# Patient Record
Sex: Female | Born: 1955 | Race: White | Hispanic: No | Smoking: Former smoker
Health system: Southern US, Community
[De-identification: ages and names within clinical notes are randomized; demographics above are authoritative.]

## PROBLEM LIST (undated history)

## (undated) ENCOUNTER — Encounter

## (undated) ENCOUNTER — Inpatient Hospital Stay

## (undated) ENCOUNTER — Telehealth

## (undated) ENCOUNTER — Other Ambulatory Visit

## (undated) DIAGNOSIS — I82409 Acute embolism and thrombosis of unspecified deep veins of unspecified lower extremity: Secondary | ICD-10-CM

## (undated) DIAGNOSIS — M4800 Spinal stenosis, site unspecified: Secondary | ICD-10-CM

## (undated) HISTORY — DX: Spinal stenosis, site unspecified: M48.00

## (undated) HISTORY — PX: HEMORROIDECTOMY: SUR656

## (undated) HISTORY — PX: CHOLECYSTECTOMY: SHX55

---

## 1999-03-21 ENCOUNTER — Other Ambulatory Visit: Admission: RE | Admit: 1999-03-21 | Discharge: 1999-03-21 | Payer: Self-pay | Admitting: Obstetrics & Gynecology

## 2000-04-02 ENCOUNTER — Other Ambulatory Visit: Admission: RE | Admit: 2000-04-02 | Discharge: 2000-04-02 | Payer: Self-pay | Admitting: Obstetrics & Gynecology

## 2001-02-25 ENCOUNTER — Encounter: Payer: Self-pay | Admitting: Internal Medicine

## 2001-02-25 ENCOUNTER — Inpatient Hospital Stay (HOSPITAL_COMMUNITY): Admission: EM | Admit: 2001-02-25 | Discharge: 2001-02-28 | Payer: Self-pay | Admitting: Emergency Medicine

## 2001-03-04 ENCOUNTER — Encounter: Admission: RE | Admit: 2001-03-04 | Discharge: 2001-03-04 | Payer: Self-pay

## 2001-03-09 ENCOUNTER — Encounter: Admission: RE | Admit: 2001-03-09 | Discharge: 2001-03-09 | Payer: Self-pay | Admitting: Internal Medicine

## 2001-03-16 ENCOUNTER — Encounter: Admission: RE | Admit: 2001-03-16 | Discharge: 2001-03-16 | Payer: Self-pay

## 2001-03-24 ENCOUNTER — Encounter: Admission: RE | Admit: 2001-03-24 | Discharge: 2001-03-24 | Payer: Self-pay | Admitting: Internal Medicine

## 2001-03-30 ENCOUNTER — Encounter: Admission: RE | Admit: 2001-03-30 | Discharge: 2001-03-30 | Payer: Self-pay | Admitting: Internal Medicine

## 2001-05-01 ENCOUNTER — Ambulatory Visit: Admission: RE | Admit: 2001-05-01 | Discharge: 2001-05-01 | Payer: Self-pay | Admitting: Hematology & Oncology

## 2001-06-16 ENCOUNTER — Ambulatory Visit: Admission: RE | Admit: 2001-06-16 | Discharge: 2001-06-16 | Payer: Self-pay | Admitting: Hematology & Oncology

## 2001-06-23 ENCOUNTER — Other Ambulatory Visit: Admission: RE | Admit: 2001-06-23 | Discharge: 2001-06-23 | Payer: Self-pay | Admitting: Obstetrics & Gynecology

## 2001-07-16 ENCOUNTER — Emergency Department (HOSPITAL_COMMUNITY): Admission: EM | Admit: 2001-07-16 | Discharge: 2001-07-16 | Payer: Self-pay | Admitting: Emergency Medicine

## 2001-12-01 ENCOUNTER — Encounter: Payer: Self-pay | Admitting: Emergency Medicine

## 2001-12-01 ENCOUNTER — Emergency Department (HOSPITAL_COMMUNITY): Admission: EM | Admit: 2001-12-01 | Discharge: 2001-12-01 | Payer: Self-pay | Admitting: Emergency Medicine

## 2001-12-02 ENCOUNTER — Emergency Department (HOSPITAL_COMMUNITY): Admission: EM | Admit: 2001-12-02 | Discharge: 2001-12-02 | Payer: Self-pay | Admitting: Emergency Medicine

## 2002-01-14 ENCOUNTER — Inpatient Hospital Stay (HOSPITAL_COMMUNITY): Admission: EM | Admit: 2002-01-14 | Discharge: 2002-01-16 | Payer: Self-pay | Admitting: Emergency Medicine

## 2002-01-14 ENCOUNTER — Encounter: Payer: Self-pay | Admitting: Emergency Medicine

## 2002-01-22 ENCOUNTER — Ambulatory Visit (HOSPITAL_COMMUNITY): Admission: RE | Admit: 2002-01-22 | Discharge: 2002-01-22 | Payer: Self-pay | Admitting: *Deleted

## 2002-01-22 ENCOUNTER — Encounter: Payer: Self-pay | Admitting: *Deleted

## 2002-10-18 ENCOUNTER — Other Ambulatory Visit: Admission: RE | Admit: 2002-10-18 | Discharge: 2002-10-18 | Payer: Self-pay | Admitting: Obstetrics & Gynecology

## 2003-04-08 ENCOUNTER — Ambulatory Visit: Admission: RE | Admit: 2003-04-08 | Discharge: 2003-04-08 | Payer: Self-pay | Admitting: Hematology & Oncology

## 2003-05-06 ENCOUNTER — Emergency Department (HOSPITAL_COMMUNITY): Admission: EM | Admit: 2003-05-06 | Discharge: 2003-05-06 | Payer: Self-pay | Admitting: Emergency Medicine

## 2003-06-13 ENCOUNTER — Emergency Department (HOSPITAL_COMMUNITY): Admission: EM | Admit: 2003-06-13 | Discharge: 2003-06-13 | Payer: Self-pay | Admitting: *Deleted

## 2003-09-08 ENCOUNTER — Ambulatory Visit (HOSPITAL_COMMUNITY): Admission: RE | Admit: 2003-09-08 | Discharge: 2003-09-08 | Payer: Self-pay | Admitting: *Deleted

## 2003-10-13 ENCOUNTER — Emergency Department (HOSPITAL_COMMUNITY): Admission: EM | Admit: 2003-10-13 | Discharge: 2003-10-13 | Payer: Self-pay | Admitting: Emergency Medicine

## 2003-11-07 ENCOUNTER — Ambulatory Visit (HOSPITAL_COMMUNITY): Admission: RE | Admit: 2003-11-07 | Discharge: 2003-11-07 | Payer: Self-pay | Admitting: Hematology & Oncology

## 2003-12-07 ENCOUNTER — Other Ambulatory Visit: Admission: RE | Admit: 2003-12-07 | Discharge: 2003-12-07 | Payer: Self-pay | Admitting: Obstetrics & Gynecology

## 2003-12-22 ENCOUNTER — Ambulatory Visit (HOSPITAL_BASED_OUTPATIENT_CLINIC_OR_DEPARTMENT_OTHER): Admission: RE | Admit: 2003-12-22 | Discharge: 2003-12-22 | Payer: Self-pay | Admitting: Internal Medicine

## 2003-12-31 ENCOUNTER — Ambulatory Visit: Payer: Self-pay | Admitting: Hematology & Oncology

## 2004-01-11 ENCOUNTER — Ambulatory Visit (HOSPITAL_COMMUNITY): Admission: RE | Admit: 2004-01-11 | Discharge: 2004-01-11 | Payer: Self-pay | Admitting: Obstetrics & Gynecology

## 2004-01-11 ENCOUNTER — Encounter (INDEPENDENT_AMBULATORY_CARE_PROVIDER_SITE_OTHER): Payer: Self-pay | Admitting: *Deleted

## 2004-03-14 ENCOUNTER — Ambulatory Visit (HOSPITAL_COMMUNITY): Admission: RE | Admit: 2004-03-14 | Discharge: 2004-03-14 | Payer: Self-pay | Admitting: Hematology & Oncology

## 2004-03-30 ENCOUNTER — Ambulatory Visit: Payer: Self-pay | Admitting: Hematology & Oncology

## 2004-04-02 ENCOUNTER — Ambulatory Visit (HOSPITAL_COMMUNITY): Admission: RE | Admit: 2004-04-02 | Discharge: 2004-04-02 | Payer: Self-pay | Admitting: Hematology & Oncology

## 2004-05-23 ENCOUNTER — Ambulatory Visit: Payer: Self-pay | Admitting: Hematology & Oncology

## 2004-07-18 ENCOUNTER — Ambulatory Visit: Payer: Self-pay | Admitting: Hematology & Oncology

## 2004-07-31 ENCOUNTER — Emergency Department (HOSPITAL_COMMUNITY): Admission: EM | Admit: 2004-07-31 | Discharge: 2004-07-31 | Payer: Self-pay | Admitting: Emergency Medicine

## 2004-10-10 ENCOUNTER — Ambulatory Visit: Payer: Self-pay | Admitting: Hematology & Oncology

## 2004-11-28 ENCOUNTER — Ambulatory Visit: Payer: Self-pay | Admitting: Hematology & Oncology

## 2004-12-27 ENCOUNTER — Ambulatory Visit: Admission: RE | Admit: 2004-12-27 | Discharge: 2004-12-27 | Payer: Self-pay | Admitting: Hematology & Oncology

## 2005-01-23 ENCOUNTER — Ambulatory Visit: Payer: Self-pay | Admitting: Hematology & Oncology

## 2005-02-07 ENCOUNTER — Other Ambulatory Visit: Admission: RE | Admit: 2005-02-07 | Discharge: 2005-02-07 | Payer: Self-pay | Admitting: Obstetrics and Gynecology

## 2005-03-13 ENCOUNTER — Ambulatory Visit: Payer: Self-pay | Admitting: Hematology & Oncology

## 2005-05-01 ENCOUNTER — Ambulatory Visit: Payer: Self-pay | Admitting: Hematology & Oncology

## 2005-05-14 ENCOUNTER — Ambulatory Visit: Payer: Self-pay | Admitting: Cardiology

## 2005-06-13 LAB — PROTIME-INR
INR: 1.1 — ABNORMAL LOW (ref 2.00–3.50)
Protime: 13.3 Seconds (ref 10.6–13.4)

## 2005-06-17 ENCOUNTER — Ambulatory Visit: Payer: Self-pay | Admitting: Hematology & Oncology

## 2005-06-27 ENCOUNTER — Encounter (INDEPENDENT_AMBULATORY_CARE_PROVIDER_SITE_OTHER): Payer: Self-pay | Admitting: *Deleted

## 2005-06-28 ENCOUNTER — Inpatient Hospital Stay (HOSPITAL_COMMUNITY): Admission: RE | Admit: 2005-06-28 | Discharge: 2005-06-29 | Payer: Self-pay | Admitting: General Surgery

## 2005-07-24 ENCOUNTER — Ambulatory Visit: Admission: RE | Admit: 2005-07-24 | Discharge: 2005-07-24 | Payer: Self-pay | Admitting: Hematology & Oncology

## 2005-07-26 LAB — PROTIME-INR: INR: 3.4 (ref 2.00–3.50)

## 2005-08-07 ENCOUNTER — Ambulatory Visit: Payer: Self-pay | Admitting: Hematology & Oncology

## 2005-08-15 ENCOUNTER — Ambulatory Visit (HOSPITAL_COMMUNITY): Payer: Self-pay | Admitting: *Deleted

## 2005-08-27 ENCOUNTER — Emergency Department (HOSPITAL_COMMUNITY): Admission: EM | Admit: 2005-08-27 | Discharge: 2005-08-28 | Payer: Self-pay | Admitting: Emergency Medicine

## 2005-09-02 LAB — CBC WITH DIFFERENTIAL/PLATELET
Eosinophils Absolute: 0 10*3/uL (ref 0.0–0.5)
LYMPH%: 12.8 % — ABNORMAL LOW (ref 14.0–48.0)
MCH: 35.2 pg — ABNORMAL HIGH (ref 26.0–34.0)
MCHC: 35.4 g/dL (ref 32.0–36.0)
MCV: 99.3 fL (ref 81.0–101.0)
MONO%: 7.9 % (ref 0.0–13.0)
NEUT#: 6.9 10*3/uL — ABNORMAL HIGH (ref 1.5–6.5)
Platelets: 322 10*3/uL (ref 145–400)
RBC: 3.69 10*6/uL — ABNORMAL LOW (ref 3.70–5.32)

## 2005-09-02 LAB — PROTIME-INR: Protime: 12.6 Seconds (ref 10.6–13.4)

## 2005-09-09 LAB — PROTIME-INR: INR: 1.6 (ref 2.00–3.50)

## 2005-10-01 ENCOUNTER — Ambulatory Visit: Payer: Self-pay | Admitting: Hematology & Oncology

## 2005-10-31 LAB — PROTIME-INR
INR: 2.1 (ref 2.00–3.50)
Protime: 25.2 Seconds — ABNORMAL HIGH (ref 10.6–13.4)

## 2005-11-07 ENCOUNTER — Ambulatory Visit: Payer: Self-pay | Admitting: Cardiology

## 2005-11-11 LAB — PROTIME-INR: Protime: 27.6 Seconds — ABNORMAL HIGH (ref 10.6–13.4)

## 2005-11-26 ENCOUNTER — Ambulatory Visit: Payer: Self-pay | Admitting: Hematology & Oncology

## 2006-01-15 ENCOUNTER — Ambulatory Visit: Payer: Self-pay | Admitting: Hematology & Oncology

## 2006-01-20 LAB — PROTIME-INR
INR: 3.2 (ref 2.00–3.50)
Protime: 38.4 Seconds — ABNORMAL HIGH (ref 10.6–13.4)

## 2006-01-20 LAB — CBC WITH DIFFERENTIAL/PLATELET
BASO%: 0.7 % (ref 0.0–2.0)
Basophils Absolute: 0 10*3/uL (ref 0.0–0.1)
EOS%: 0.7 % (ref 0.0–7.0)
HGB: 12.1 g/dL (ref 11.6–15.9)
MCH: 35.3 pg — ABNORMAL HIGH (ref 26.0–34.0)
MCHC: 35.9 g/dL (ref 32.0–36.0)
MCV: 98.1 fL (ref 81.0–101.0)
MONO%: 7.1 % (ref 0.0–13.0)
RBC: 3.44 10*6/uL — ABNORMAL LOW (ref 3.70–5.32)
RDW: 15.1 % — ABNORMAL HIGH (ref 11.3–14.5)
lymph#: 1.6 10*3/uL (ref 0.9–3.3)

## 2006-01-28 LAB — PROTIME-INR: INR: 2.5 (ref 2.00–3.50)

## 2006-02-04 LAB — PROTIME-INR
INR: 3.4 (ref 2.00–3.50)
Protime: 40.8 Seconds — ABNORMAL HIGH (ref 10.6–13.4)

## 2006-03-03 ENCOUNTER — Ambulatory Visit: Payer: Self-pay | Admitting: Hematology & Oncology

## 2006-04-21 ENCOUNTER — Ambulatory Visit: Admission: RE | Admit: 2006-04-21 | Discharge: 2006-04-21 | Payer: Self-pay | Admitting: Hematology & Oncology

## 2006-04-21 ENCOUNTER — Ambulatory Visit: Payer: Self-pay | Admitting: Hematology & Oncology

## 2006-04-21 LAB — PROTIME-INR
INR: 2.4 (ref 2.00–3.50)
Protime: 28.8 Seconds — ABNORMAL HIGH (ref 10.6–13.4)

## 2006-07-02 ENCOUNTER — Ambulatory Visit: Payer: Self-pay | Admitting: Hematology & Oncology

## 2006-09-01 ENCOUNTER — Ambulatory Visit: Payer: Self-pay | Admitting: Hematology & Oncology

## 2006-09-01 LAB — PROTIME-INR
INR: 2.3 (ref 2.00–3.50)
Protime: 27.6 Seconds — ABNORMAL HIGH (ref 10.6–13.4)

## 2006-10-20 ENCOUNTER — Ambulatory Visit: Payer: Self-pay | Admitting: Hematology & Oncology

## 2006-12-04 ENCOUNTER — Ambulatory Visit: Payer: Self-pay | Admitting: Hematology & Oncology

## 2007-01-05 LAB — PROTIME-INR: Protime: 30 Seconds — ABNORMAL HIGH (ref 10.6–13.4)

## 2007-01-16 LAB — HEPATIC FUNCTION PANEL
Albumin: 4.1 g/dL (ref 3.5–5.2)
Total Bilirubin: 0.6 mg/dL (ref 0.3–1.2)

## 2007-01-16 LAB — LIPID PANEL
Cholesterol: 195 mg/dL (ref 0–200)
HDL: 71 mg/dL (ref >39)
Total CHOL/HDL Ratio: 2.7 Ratio

## 2007-02-02 ENCOUNTER — Ambulatory Visit: Payer: Self-pay | Admitting: Hematology & Oncology

## 2007-03-26 ENCOUNTER — Ambulatory Visit: Payer: Self-pay | Admitting: Hematology & Oncology

## 2007-03-30 LAB — CBC WITH DIFFERENTIAL/PLATELET
Basophils Absolute: 0 10*3/uL (ref 0.0–0.1)
EOS%: 1.5 % (ref 0.0–7.0)
Eosinophils Absolute: 0.1 10*3/uL (ref 0.0–0.5)
HCT: 40 % (ref 34.8–46.6)
HGB: 14.5 g/dL (ref 11.6–15.9)
MCH: 34.3 pg — ABNORMAL HIGH (ref 26.0–34.0)
MCV: 94.6 fL (ref 81.0–101.0)
MONO%: 8.9 % (ref 0.0–13.0)
NEUT#: 2.6 10*3/uL (ref 1.5–6.5)
NEUT%: 56 % (ref 39.6–76.8)
RDW: 10.5 % — ABNORMAL LOW (ref 11.3–14.5)

## 2007-03-30 LAB — PROTIME-INR: INR: 2.5 (ref 2.00–3.50)

## 2007-04-17 IMAGING — CR DG CHEST 2V
2 series · 2 of 2 positions shown · non-contrast
Comparison: none

CLINICAL DATA: 49-year-old, chest pain and shortness of breath.  
 CHEST - TWO VIEW:  
 Two views of the chest are compared to prior study from 05/06/2003.  The cardiac silhouette, mediastinal and hilar contours are stable.  There is streaky subsegmental atelectasis in the left lower lobe.  No focal infiltrate, edema, or effusion.

[view not recorded (1 of 2)]
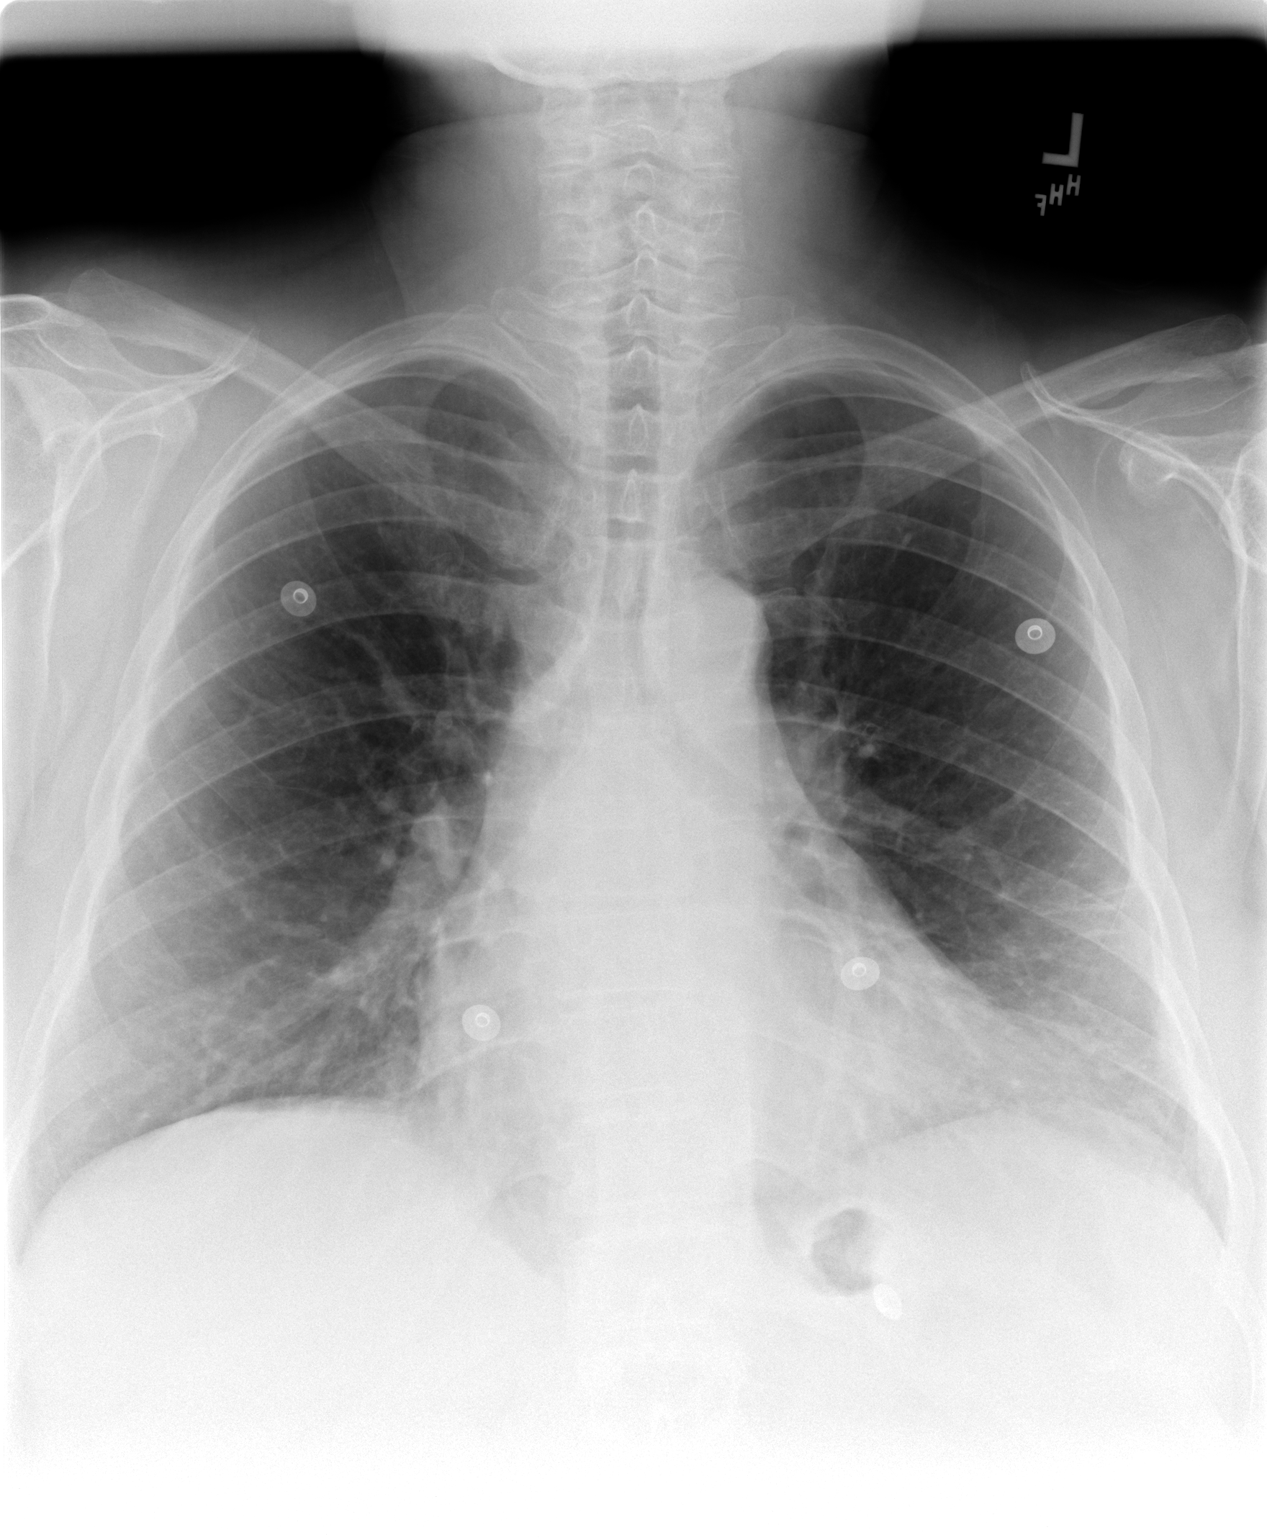

[view not recorded (2 of 2)]
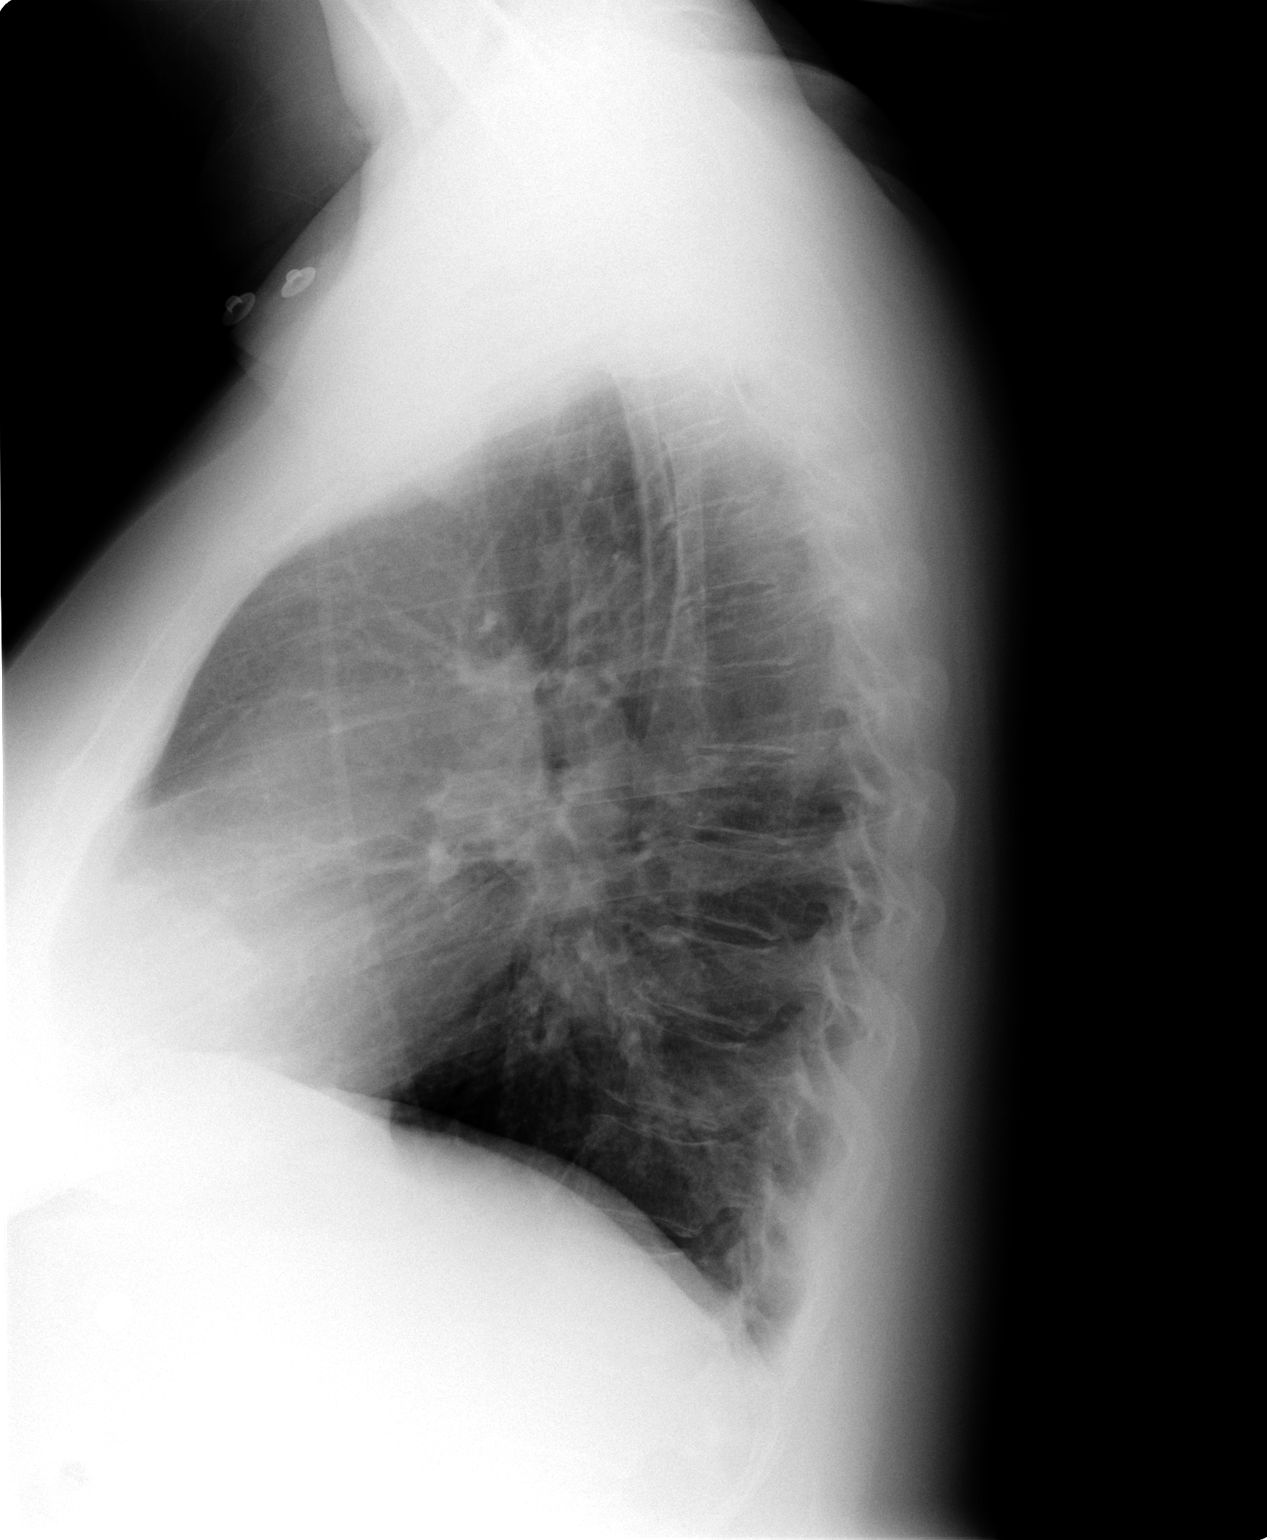

[2 of 2 positions shown; findings below may reference images not displayed]

IMPRESSION: Streaky subsegmental atelectasis in the left lower lobe.  No infiltrates, edema, or effusions.

## 2007-05-28 ENCOUNTER — Ambulatory Visit: Payer: Self-pay | Admitting: Hematology & Oncology

## 2007-06-29 LAB — CBC WITH DIFFERENTIAL/PLATELET
BASO%: 0.2 % (ref 0.0–2.0)
EOS%: 1 % (ref 0.0–7.0)
Eosinophils Absolute: 0.1 10*3/uL (ref 0.0–0.5)
LYMPH%: 27.7 % (ref 14.0–48.0)
MCH: 32.3 pg (ref 26.0–34.0)
MCHC: 35.1 g/dL (ref 32.0–36.0)
MCV: 91.9 fL (ref 81.0–101.0)
MONO%: 7.3 % (ref 0.0–13.0)
NEUT#: 3.4 10*3/uL (ref 1.5–6.5)
Platelets: 284 10*3/uL (ref 145–400)
RBC: 4.05 10*6/uL (ref 3.70–5.32)
RDW: 12.4 % (ref 11.3–14.5)

## 2007-06-29 LAB — COMPREHENSIVE METABOLIC PANEL
AST: 20 U/L (ref 0–37)
Albumin: 4.2 g/dL (ref 3.5–5.2)
Alkaline Phosphatase: 63 U/L (ref 39–117)
BUN: 8 mg/dL (ref 6–23)
Creatinine, Ser: 0.65 mg/dL (ref 0.40–1.20)
Glucose, Bld: 102 mg/dL — ABNORMAL HIGH (ref 70–99)
Total Bilirubin: 0.5 mg/dL (ref 0.3–1.2)

## 2007-06-29 LAB — PROTIME-INR
INR: 1.2 — ABNORMAL LOW (ref 2.00–3.50)
Protime: 14.4 Seconds — ABNORMAL HIGH (ref 10.6–13.4)

## 2007-07-23 ENCOUNTER — Ambulatory Visit: Payer: Self-pay | Admitting: Hematology & Oncology

## 2007-07-27 LAB — PROTIME-INR
INR: 1.8 — ABNORMAL LOW (ref 2.00–3.50)
Protime: 21.6 Seconds — ABNORMAL HIGH (ref 10.6–13.4)

## 2007-08-04 ENCOUNTER — Emergency Department (HOSPITAL_COMMUNITY): Admission: EM | Admit: 2007-08-04 | Discharge: 2007-08-04 | Payer: Self-pay | Admitting: Emergency Medicine

## 2007-08-05 ENCOUNTER — Inpatient Hospital Stay (HOSPITAL_COMMUNITY): Admission: EM | Admit: 2007-08-05 | Discharge: 2007-08-07 | Payer: Self-pay | Admitting: Emergency Medicine

## 2007-08-10 LAB — PROTIME-INR

## 2007-08-26 ENCOUNTER — Ambulatory Visit: Payer: Self-pay | Admitting: Hematology & Oncology

## 2007-09-04 LAB — PROTIME-INR (CHCC SATELLITE)
INR: 2.1 (ref 2.0–3.5)
Protime: 25.2 Seconds — ABNORMAL HIGH (ref 10.6–13.4)

## 2007-09-30 ENCOUNTER — Ambulatory Visit (HOSPITAL_COMMUNITY): Admission: RE | Admit: 2007-09-30 | Discharge: 2007-09-30 | Payer: Self-pay | Admitting: Family Medicine

## 2007-10-05 ENCOUNTER — Inpatient Hospital Stay (HOSPITAL_COMMUNITY): Admission: EM | Admit: 2007-10-05 | Discharge: 2007-10-09 | Payer: Self-pay | Admitting: Emergency Medicine

## 2007-10-07 ENCOUNTER — Encounter (INDEPENDENT_AMBULATORY_CARE_PROVIDER_SITE_OTHER): Payer: Self-pay | Admitting: Surgery

## 2007-10-28 ENCOUNTER — Ambulatory Visit: Payer: Self-pay | Admitting: Hematology & Oncology

## 2007-10-29 LAB — CBC WITH DIFFERENTIAL (CANCER CENTER ONLY)
BASO%: 0.7 % (ref 0.0–2.0)
LYMPH%: 37.7 % (ref 14.0–48.0)
MCH: 29.4 pg (ref 26.0–34.0)
MCV: 86 fL (ref 81–101)
MONO%: 5.3 % (ref 0.0–13.0)
NEUT#: 3.2 10*3/uL (ref 1.5–6.5)
Platelets: 316 10*3/uL (ref 145–400)
RBC: 4.85 10*6/uL (ref 3.70–5.32)
RDW: 11.9 % (ref 10.5–14.6)
WBC: 5.9 10*3/uL (ref 3.9–10.0)

## 2007-12-14 ENCOUNTER — Ambulatory Visit: Payer: Self-pay | Admitting: Cardiology

## 2007-12-25 ENCOUNTER — Ambulatory Visit: Payer: Self-pay | Admitting: Hematology & Oncology

## 2008-01-14 LAB — CBC WITH DIFFERENTIAL (CANCER CENTER ONLY)
BASO#: 0.1 10*3/uL (ref 0.0–0.2)
EOS%: 1.7 % (ref 0.0–7.0)
Eosinophils Absolute: 0.1 10*3/uL (ref 0.0–0.5)
LYMPH%: 40.8 % (ref 14.0–48.0)
MCH: 30.1 pg (ref 26.0–34.0)
MCHC: 34.4 g/dL (ref 32.0–36.0)
MCV: 87 fL (ref 81–101)
MONO%: 6.3 % (ref 0.0–13.0)
NEUT#: 3 10*3/uL (ref 1.5–6.5)
Platelets: 267 10*3/uL (ref 145–400)
RBC: 4.54 10*6/uL (ref 3.70–5.32)

## 2008-01-14 LAB — PROTIME-INR (CHCC SATELLITE): Protime: 18 Seconds — ABNORMAL HIGH (ref 10.6–13.4)

## 2008-01-14 LAB — COMPREHENSIVE METABOLIC PANEL
ALT: 15 U/L (ref 0–35)
AST: 17 U/L (ref 0–37)
Albumin: 4.2 g/dL (ref 3.5–5.2)
Calcium: 9.2 mg/dL (ref 8.4–10.5)
Chloride: 103 mEq/L (ref 96–112)
Potassium: 3.9 mEq/L (ref 3.5–5.3)
Total Protein: 7.5 g/dL (ref 6.0–8.3)

## 2008-01-31 ENCOUNTER — Encounter: Admission: RE | Admit: 2008-01-31 | Discharge: 2008-01-31 | Payer: Self-pay | Admitting: Hematology & Oncology

## 2008-03-17 DIAGNOSIS — F17200 Nicotine dependence, unspecified, uncomplicated: Secondary | ICD-10-CM

## 2008-03-17 DIAGNOSIS — Z8672 Personal history of thrombophlebitis: Secondary | ICD-10-CM

## 2008-03-17 DIAGNOSIS — E785 Hyperlipidemia, unspecified: Secondary | ICD-10-CM | POA: Insufficient documentation

## 2008-03-17 DIAGNOSIS — D682 Hereditary deficiency of other clotting factors: Secondary | ICD-10-CM

## 2008-03-17 DIAGNOSIS — E669 Obesity, unspecified: Secondary | ICD-10-CM

## 2008-03-18 ENCOUNTER — Ambulatory Visit: Payer: Self-pay | Admitting: Hematology & Oncology

## 2008-05-02 LAB — CBC WITH DIFFERENTIAL (CANCER CENTER ONLY)
BASO#: 0.1 10*3/uL (ref 0.0–0.2)
EOS%: 2.3 % (ref 0.0–7.0)
Eosinophils Absolute: 0.1 10*3/uL (ref 0.0–0.5)
HCT: 44.8 % (ref 34.8–46.6)
HGB: 14.8 g/dL (ref 11.6–15.9)
LYMPH%: 31.4 % (ref 14.0–48.0)
MCH: 30.2 pg (ref 26.0–34.0)
MCHC: 33.1 g/dL (ref 32.0–36.0)
MCV: 91 fL (ref 81–101)
MONO%: 5.9 % (ref 0.0–13.0)
NEUT%: 59.6 % (ref 39.6–80.0)
RBC: 4.92 10*6/uL (ref 3.70–5.32)

## 2008-05-02 LAB — PROTIME-INR (CHCC SATELLITE)
INR: 1.2 — ABNORMAL LOW (ref 2.0–3.5)
Protime: 14.4 Seconds — ABNORMAL HIGH (ref 10.6–13.4)

## 2008-06-21 ENCOUNTER — Ambulatory Visit: Payer: Self-pay | Admitting: Hematology & Oncology

## 2008-06-22 LAB — CBC WITH DIFFERENTIAL (CANCER CENTER ONLY)
BASO#: 0 10*3/uL (ref 0.0–0.2)
EOS%: 1.5 % (ref 0.0–7.0)
Eosinophils Absolute: 0.1 10*3/uL (ref 0.0–0.5)
HCT: 41.4 % (ref 34.8–46.6)
HGB: 14.2 g/dL (ref 11.6–15.9)
LYMPH#: 2.6 10*3/uL (ref 0.9–3.3)
MCHC: 34.3 g/dL (ref 32.0–36.0)
MONO#: 0.4 10*3/uL (ref 0.1–0.9)
NEUT#: 2.8 10*3/uL (ref 1.5–6.5)
NEUT%: 47 % (ref 39.6–80.0)
RBC: 4.65 10*6/uL (ref 3.70–5.32)

## 2008-06-22 LAB — PROTIME-INR (CHCC SATELLITE): INR: 2 (ref 2.0–3.5)

## 2008-07-07 ENCOUNTER — Encounter: Payer: Self-pay | Admitting: Cardiology

## 2008-07-12 ENCOUNTER — Telehealth: Payer: Self-pay | Admitting: Cardiology

## 2008-08-20 DIAGNOSIS — J449 Chronic obstructive pulmonary disease, unspecified: Secondary | ICD-10-CM | POA: Insufficient documentation

## 2008-08-20 DIAGNOSIS — D509 Iron deficiency anemia, unspecified: Secondary | ICD-10-CM | POA: Insufficient documentation

## 2008-08-20 DIAGNOSIS — F319 Bipolar disorder, unspecified: Secondary | ICD-10-CM | POA: Insufficient documentation

## 2008-08-20 DIAGNOSIS — J4489 Other specified chronic obstructive pulmonary disease: Secondary | ICD-10-CM | POA: Insufficient documentation

## 2008-08-20 DIAGNOSIS — K589 Irritable bowel syndrome without diarrhea: Secondary | ICD-10-CM | POA: Insufficient documentation

## 2008-08-20 DIAGNOSIS — D518 Other vitamin B12 deficiency anemias: Secondary | ICD-10-CM

## 2008-08-20 DIAGNOSIS — N3010 Interstitial cystitis (chronic) without hematuria: Secondary | ICD-10-CM

## 2008-08-20 DIAGNOSIS — G43909 Migraine, unspecified, not intractable, without status migrainosus: Secondary | ICD-10-CM | POA: Insufficient documentation

## 2008-08-20 DIAGNOSIS — G4730 Sleep apnea, unspecified: Secondary | ICD-10-CM | POA: Insufficient documentation

## 2008-08-20 DIAGNOSIS — F411 Generalized anxiety disorder: Secondary | ICD-10-CM | POA: Insufficient documentation

## 2008-08-24 ENCOUNTER — Ambulatory Visit: Payer: Self-pay | Admitting: Hematology & Oncology

## 2008-08-26 ENCOUNTER — Encounter: Payer: Self-pay | Admitting: Cardiology

## 2008-08-26 LAB — CBC WITH DIFFERENTIAL (CANCER CENTER ONLY)
BASO%: 1.1 % (ref 0.0–2.0)
HCT: 42.2 % (ref 34.8–46.6)
HGB: 14.1 g/dL (ref 11.6–15.9)
LYMPH#: 2.1 10*3/uL (ref 0.9–3.3)
MONO#: 0.4 10*3/uL (ref 0.1–0.9)
NEUT#: 3.7 10*3/uL (ref 1.5–6.5)
NEUT%: 57.4 % (ref 39.6–80.0)
RDW: 11.4 % (ref 10.5–14.6)
WBC: 6.4 10*3/uL (ref 3.9–10.0)

## 2008-11-15 ENCOUNTER — Ambulatory Visit: Payer: Self-pay | Admitting: Cardiology

## 2008-11-17 ENCOUNTER — Ambulatory Visit: Payer: Self-pay | Admitting: Hematology & Oncology

## 2008-11-21 ENCOUNTER — Encounter: Payer: Self-pay | Admitting: Cardiology

## 2008-11-21 LAB — CBC WITH DIFFERENTIAL (CANCER CENTER ONLY)
BASO%: 0.9 % (ref 0.0–2.0)
EOS%: 1.9 % (ref 0.0–7.0)
LYMPH#: 2.1 10*3/uL (ref 0.9–3.3)
LYMPH%: 31.3 % (ref 14.0–48.0)
MCHC: 34.4 g/dL (ref 32.0–36.0)
MONO#: 0.5 10*3/uL (ref 0.1–0.9)
Platelets: 319 10*3/uL (ref 145–400)
RDW: 11.2 % (ref 10.5–14.6)
WBC: 6.6 10*3/uL (ref 3.9–10.0)

## 2008-11-21 LAB — PROTIME-INR (CHCC SATELLITE)

## 2008-11-22 LAB — COMPREHENSIVE METABOLIC PANEL
ALT: 12 U/L (ref 0–35)
CO2: 23 mEq/L (ref 19–32)
Chloride: 104 mEq/L (ref 96–112)
Sodium: 138 mEq/L (ref 135–145)
Total Bilirubin: 0.5 mg/dL (ref 0.3–1.2)
Total Protein: 7.5 g/dL (ref 6.0–8.3)

## 2008-11-22 LAB — VITAMIN D 25 HYDROXY (VIT D DEFICIENCY, FRACTURES): Vit D, 25-Hydroxy: 31 ng/mL (ref 30–89)

## 2008-11-23 ENCOUNTER — Telehealth: Payer: Self-pay | Admitting: Cardiology

## 2008-12-30 ENCOUNTER — Ambulatory Visit: Payer: Self-pay | Admitting: Hematology & Oncology

## 2009-02-07 ENCOUNTER — Ambulatory Visit: Payer: Self-pay | Admitting: Hematology & Oncology

## 2009-02-08 ENCOUNTER — Encounter: Payer: Self-pay | Admitting: Cardiology

## 2009-02-08 LAB — CBC WITH DIFFERENTIAL (CANCER CENTER ONLY)
BASO#: 0 10*3/uL (ref 0.0–0.2)
Eosinophils Absolute: 0.1 10*3/uL (ref 0.0–0.5)
HCT: 38 % (ref 34.8–46.6)
HGB: 13.6 g/dL (ref 11.6–15.9)
LYMPH%: 31.2 % (ref 14.0–48.0)
MCH: 30.3 pg (ref 26.0–34.0)
MCV: 85 fL (ref 81–101)
MONO%: 7.7 % (ref 0.0–13.0)
RBC: 4.5 10*6/uL (ref 3.70–5.32)

## 2009-02-08 LAB — PROTIME-INR (CHCC SATELLITE)

## 2009-03-27 ENCOUNTER — Ambulatory Visit: Payer: Self-pay | Admitting: Oncology

## 2009-03-28 ENCOUNTER — Other Ambulatory Visit: Payer: Self-pay | Admitting: Hematology & Oncology

## 2009-03-28 ENCOUNTER — Encounter: Admission: RE | Admit: 2009-03-28 | Discharge: 2009-03-28 | Payer: Self-pay | Admitting: Neurosurgery

## 2009-03-28 LAB — PROTIME-INR: Protime: 14.4 Seconds — ABNORMAL HIGH (ref 10.6–13.4)

## 2009-04-26 ENCOUNTER — Ambulatory Visit: Payer: Self-pay | Admitting: Hematology & Oncology

## 2009-04-27 LAB — PROTIME-INR (CHCC SATELLITE)

## 2009-04-27 LAB — VITAMIN D 25 HYDROXY (VIT D DEFICIENCY, FRACTURES): Vit D, 25-Hydroxy: 17 ng/mL — ABNORMAL LOW (ref 30–89)

## 2009-04-27 LAB — CBC WITH DIFFERENTIAL (CANCER CENTER ONLY)
BASO#: 0.1 10*3/uL (ref 0.0–0.2)
BASO%: 0.8 % (ref 0.0–2.0)
EOS%: 2.2 % (ref 0.0–7.0)
HCT: 40.8 % (ref 34.8–46.6)
HGB: 13.6 g/dL (ref 11.6–15.9)
LYMPH#: 2.2 10*3/uL (ref 0.9–3.3)
MCH: 28.9 pg (ref 26.0–34.0)
MCHC: 33.4 g/dL (ref 32.0–36.0)
MONO%: 6 % (ref 0.0–13.0)
NEUT%: 57.9 % (ref 39.6–80.0)
RDW: 12.1 % (ref 10.5–14.6)

## 2009-04-27 LAB — COMPREHENSIVE METABOLIC PANEL
BUN: 10 mg/dL (ref 6–23)
CO2: 19 mEq/L (ref 19–32)
Calcium: 9.1 mg/dL (ref 8.4–10.5)
Chloride: 104 mEq/L (ref 96–112)
Creatinine, Ser: 0.64 mg/dL (ref 0.40–1.20)
Glucose, Bld: 91 mg/dL (ref 70–99)
Total Bilirubin: 0.4 mg/dL (ref 0.3–1.2)

## 2009-05-03 ENCOUNTER — Encounter: Admission: RE | Admit: 2009-05-03 | Discharge: 2009-05-03 | Payer: Self-pay | Admitting: Neurosurgery

## 2009-05-03 LAB — PROTIME-INR
INR: 1.3 — ABNORMAL LOW (ref 2.00–3.50)
Protime: 15.6 Seconds — ABNORMAL HIGH (ref 10.6–13.4)

## 2009-06-21 ENCOUNTER — Ambulatory Visit: Payer: Self-pay | Admitting: Hematology & Oncology

## 2009-06-22 LAB — PROTIME-INR (CHCC SATELLITE): INR: 2.8 (ref 2.0–3.5)

## 2009-07-10 ENCOUNTER — Emergency Department (HOSPITAL_COMMUNITY): Admission: EM | Admit: 2009-07-10 | Discharge: 2009-07-10 | Payer: Self-pay | Admitting: Emergency Medicine

## 2009-07-20 ENCOUNTER — Ambulatory Visit: Payer: Self-pay | Admitting: Hematology & Oncology

## 2009-07-20 LAB — CBC WITH DIFFERENTIAL (CANCER CENTER ONLY)
BASO#: 0.1 10*3/uL (ref 0.0–0.2)
Eosinophils Absolute: 0.1 10*3/uL (ref 0.0–0.5)
HGB: 12.7 g/dL (ref 11.6–15.9)
LYMPH#: 3.7 10*3/uL — ABNORMAL HIGH (ref 0.9–3.3)
MONO%: 8.5 % (ref 0.0–13.0)
NEUT#: 5.4 10*3/uL (ref 1.5–6.5)
Platelets: 277 10*3/uL (ref 145–400)
RBC: 4.22 10*6/uL (ref 3.70–5.32)
WBC: 10.2 10*3/uL — ABNORMAL HIGH (ref 3.9–10.0)

## 2009-07-20 LAB — PROTIME-INR (CHCC SATELLITE)
INR: 3.2 (ref 2.0–3.5)
Protime: 38.4 Seconds — ABNORMAL HIGH (ref 10.6–13.4)

## 2009-08-14 LAB — PROTIME-INR (CHCC SATELLITE)
INR: 3.7 — ABNORMAL HIGH (ref 2.0–3.5)
Protime: 44.4 Seconds — ABNORMAL HIGH (ref 10.6–13.4)

## 2009-09-14 ENCOUNTER — Ambulatory Visit: Payer: Self-pay | Admitting: Hematology & Oncology

## 2009-10-04 LAB — PROTIME-INR (CHCC SATELLITE): Protime: 18 Seconds — ABNORMAL HIGH (ref 10.6–13.4)

## 2009-10-16 ENCOUNTER — Ambulatory Visit: Payer: Self-pay | Admitting: Hematology & Oncology

## 2009-11-21 ENCOUNTER — Ambulatory Visit: Payer: Self-pay | Admitting: Hematology & Oncology

## 2009-11-23 LAB — CBC WITH DIFFERENTIAL (CANCER CENTER ONLY)
BASO#: 0 10*3/uL (ref 0.0–0.2)
EOS%: 2 % (ref 0.0–7.0)
Eosinophils Absolute: 0.1 10*3/uL (ref 0.0–0.5)
HCT: 42.8 % (ref 34.8–46.6)
HGB: 14.5 g/dL (ref 11.6–15.9)
LYMPH#: 1.9 10*3/uL (ref 0.9–3.3)
MCHC: 33.9 g/dL (ref 32.0–36.0)
NEUT%: 56.8 % (ref 39.6–80.0)
RBC: 5.04 10*6/uL (ref 3.70–5.32)

## 2009-11-23 LAB — COMPREHENSIVE METABOLIC PANEL
ALT: 16 U/L (ref 0–35)
AST: 18 U/L (ref 0–37)
Albumin: 4.2 g/dL (ref 3.5–5.2)
CO2: 25 mEq/L (ref 19–32)
Calcium: 9.4 mg/dL (ref 8.4–10.5)
Chloride: 102 mEq/L (ref 96–112)
Creatinine, Ser: 0.8 mg/dL (ref 0.40–1.20)
Potassium: 4.4 mEq/L (ref 3.5–5.3)
Sodium: 138 mEq/L (ref 135–145)
Total Protein: 7.1 g/dL (ref 6.0–8.3)

## 2009-11-23 LAB — PROTIME-INR (CHCC SATELLITE)

## 2009-12-04 ENCOUNTER — Ambulatory Visit: Payer: Self-pay | Admitting: Family Medicine

## 2009-12-22 ENCOUNTER — Ambulatory Visit: Payer: Self-pay | Admitting: Hematology & Oncology

## 2010-01-10 LAB — PROTIME-INR (CHCC SATELLITE)
INR: 4.1 — ABNORMAL HIGH (ref 2.0–3.5)
Protime: 49.2 Seconds — ABNORMAL HIGH (ref 10.6–13.4)

## 2010-01-23 ENCOUNTER — Ambulatory Visit: Payer: Self-pay | Admitting: Hematology & Oncology

## 2010-01-30 LAB — PROTIME-INR (CHCC SATELLITE): Protime: 22.8 Seconds — ABNORMAL HIGH (ref 10.6–13.4)

## 2010-03-02 ENCOUNTER — Ambulatory Visit: Payer: Self-pay | Admitting: Hematology & Oncology

## 2010-03-15 LAB — CBC WITH DIFFERENTIAL (CANCER CENTER ONLY)
BASO#: 0 10*3/uL (ref 0.0–0.2)
BASO%: 0.7 % (ref 0.0–2.0)
EOS%: 2.3 % (ref 0.0–7.0)
Eosinophils Absolute: 0.1 10*3/uL (ref 0.0–0.5)
HCT: 39.1 % (ref 34.8–46.6)
HGB: 13.4 g/dL (ref 11.6–15.9)
LYMPH#: 2.1 10*3/uL (ref 0.9–3.3)
LYMPH%: 41.3 % (ref 14.0–48.0)
MCH: 29.2 pg (ref 26.0–34.0)
MCHC: 34.2 g/dL (ref 32.0–36.0)
MCV: 85 fL (ref 81–101)
MONO#: 0.4 10*3/uL (ref 0.1–0.9)
MONO%: 7.2 % (ref 0.0–13.0)
NEUT#: 2.5 10*3/uL (ref 1.5–6.5)
NEUT%: 48.5 % (ref 39.6–80.0)
Platelets: 293 10*3/uL (ref 145–400)
RBC: 4.59 10*6/uL (ref 3.70–5.32)
RDW: 11.2 % (ref 10.5–14.6)
WBC: 5.1 10*3/uL (ref 3.9–10.0)

## 2010-03-15 LAB — PROTIME-INR (CHCC SATELLITE)
INR: 2.2 (ref 2.0–3.5)
Protime: 26.4 Seconds — ABNORMAL HIGH (ref 10.6–13.4)

## 2010-03-15 IMAGING — CT CT HEAD W/O CM - CT MAXILLOFACIAL W/O CM
1 series · 15 of 17 positions shown, 19 images · non-contrast
Comparison: NONE

CLINICAL DATA: Ringor:Budi Susanto Poe, M.D. Frontal headaches. 

CT PARANASAL SINUSES
TECHNIQUE: Multiple axial images were obtained.

[Series 2: — · axial · 0.42mm/px · z∈[+636,+734]mm · 15 of 17 slices shown, 19 images]
[im 2/17  brain]
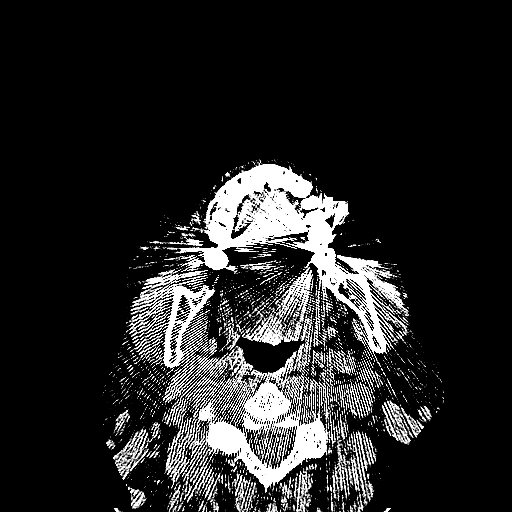
[im 2/17  bone]
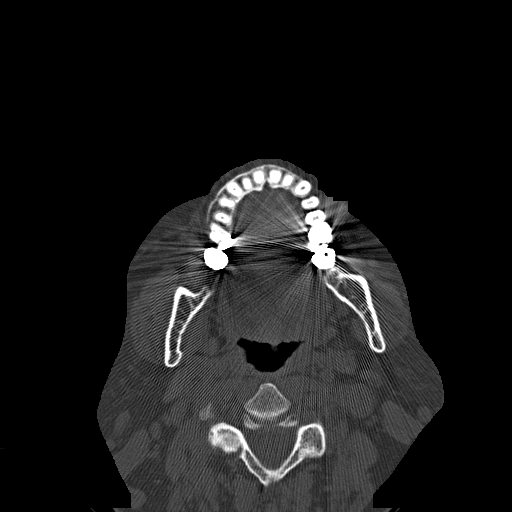
[im 3/17  bone]
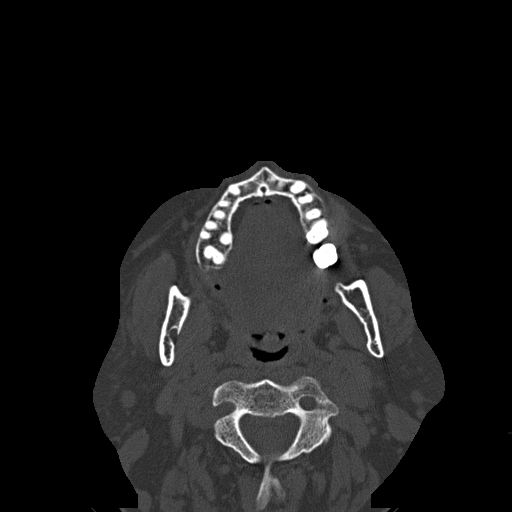
[im 4/17  bone]
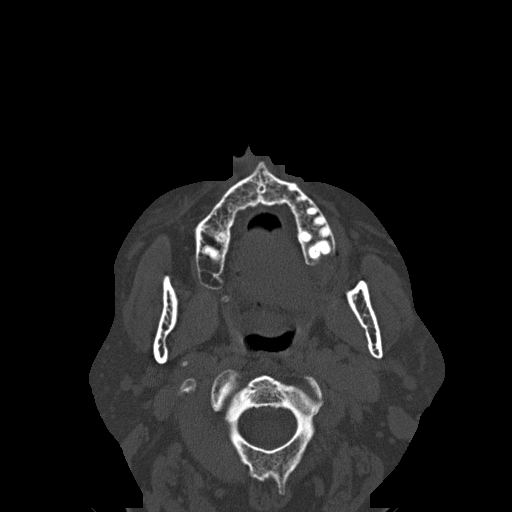
[im 5/17  bone]
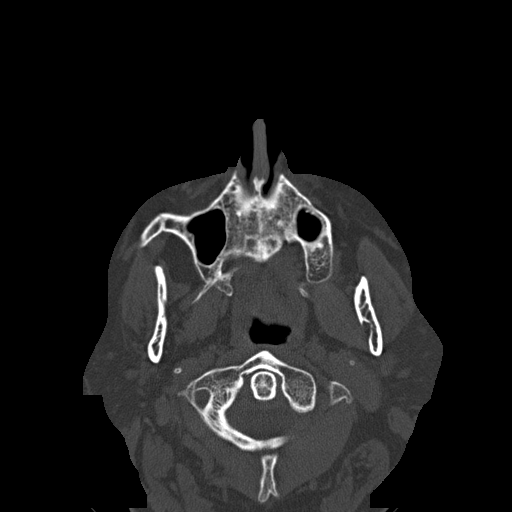
[im 6/17  brain]
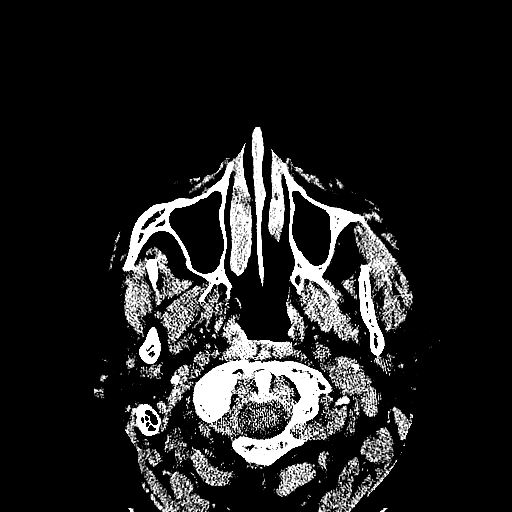
[im 6/17  bone]
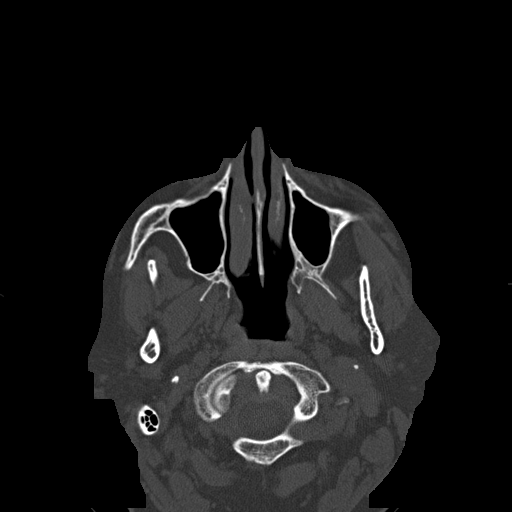
[im 7/17  bone]
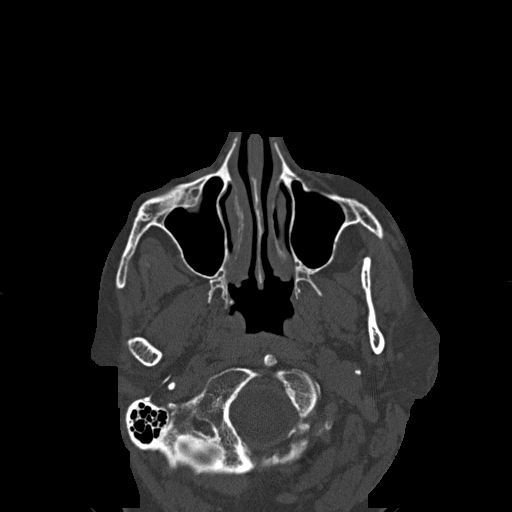
[im 8/17  bone]
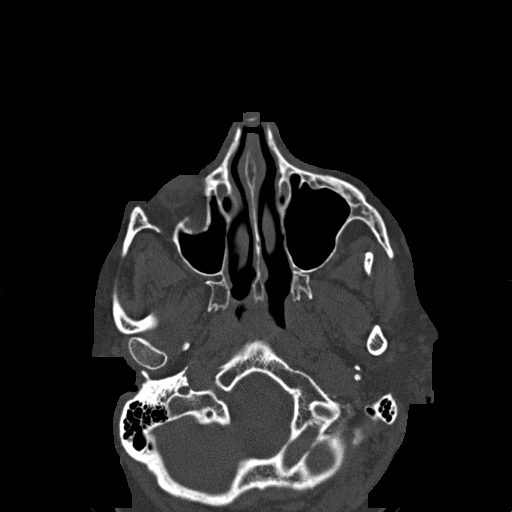
[im 9/17  bone]
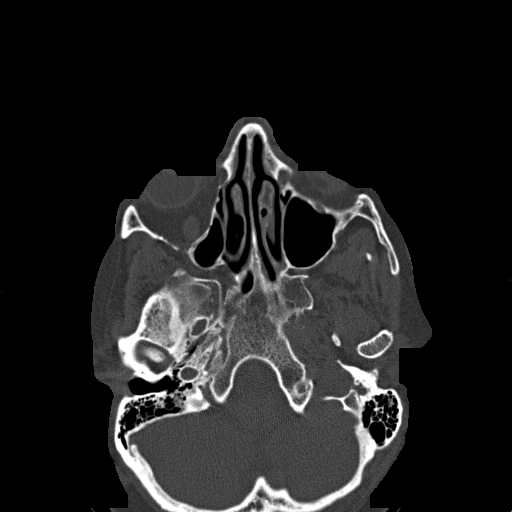
[im 10/17  brain]
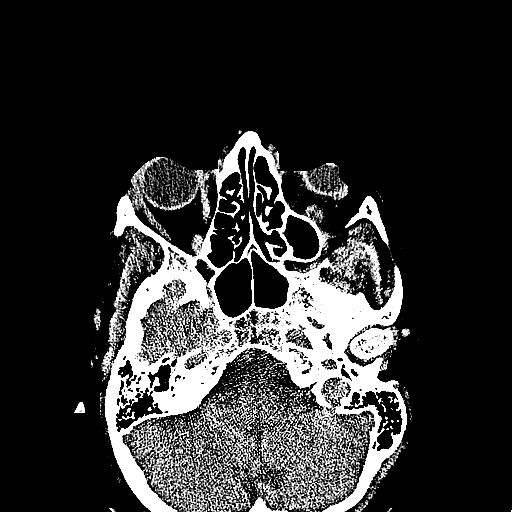
[im 10/17  bone]
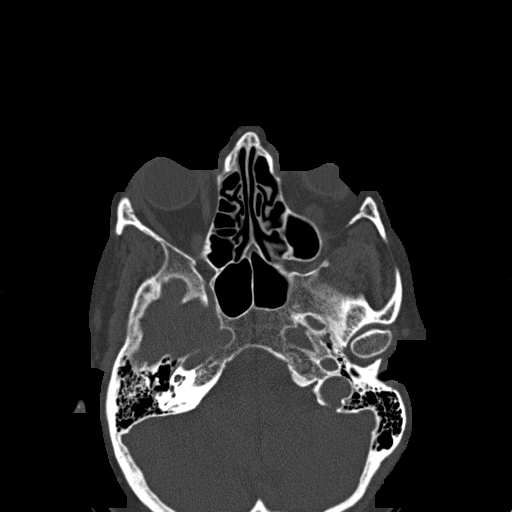
[im 11/17  bone]
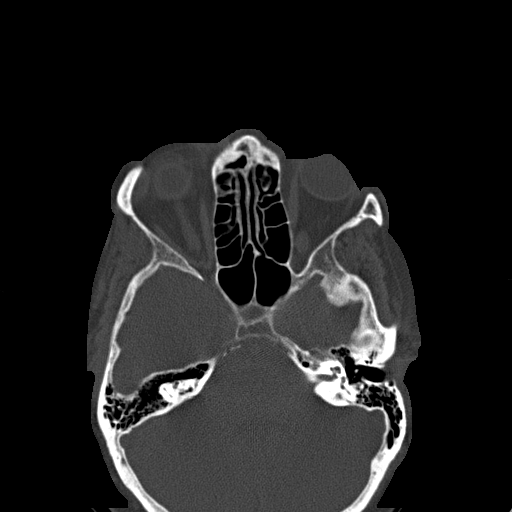
[im 12/17  bone]
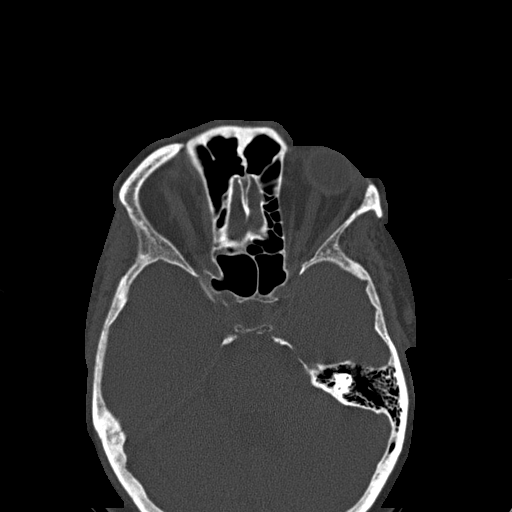
[im 13/17  bone]
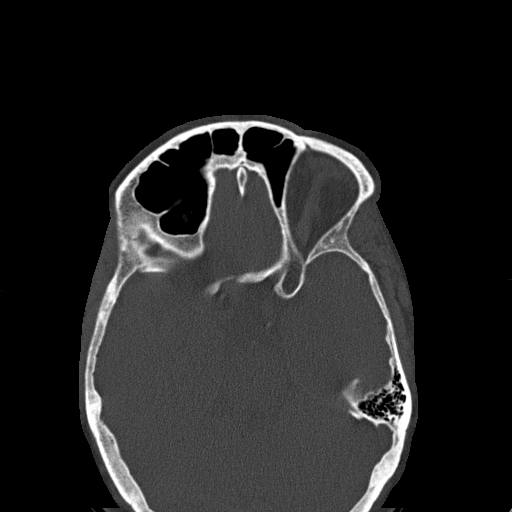
[im 14/17  brain]
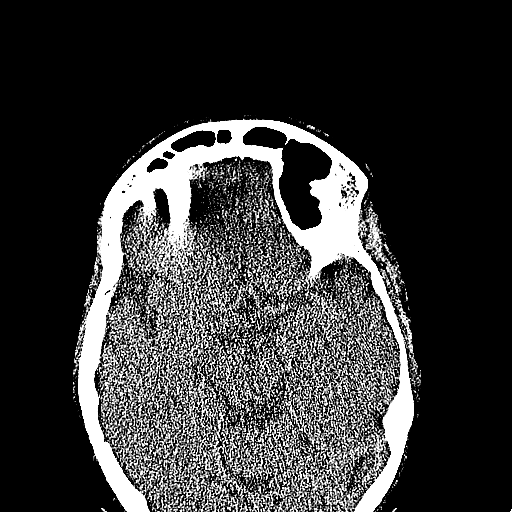
[im 14/17  bone]
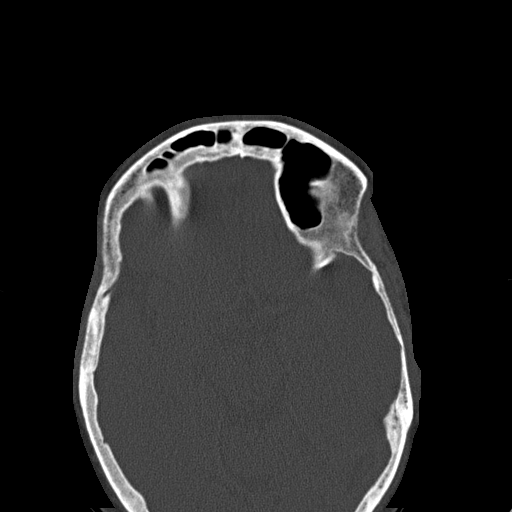
[im 15/17  bone]
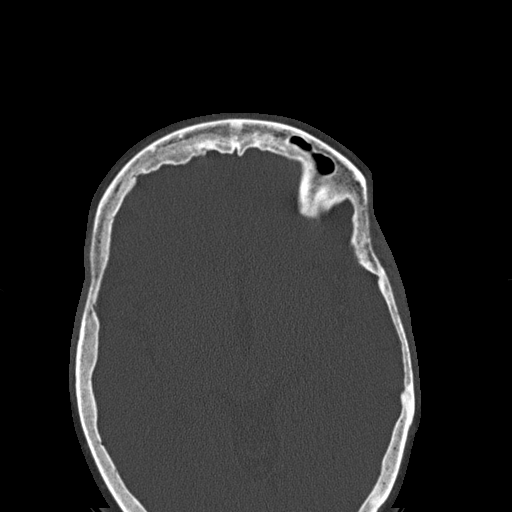
[im 16/17  bone]
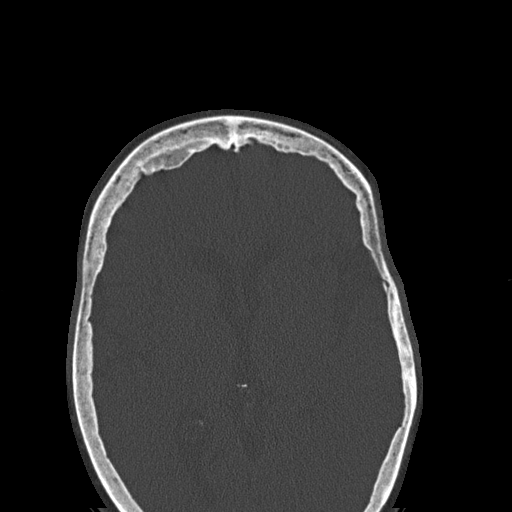

[15 of 17 positions shown; findings below may reference images not displayed]

FINDINGS: There is no evidence of mucosal thickening or air-fluid 
level in the paranasal sinuses.  Orbits and mastoids are 
unremarkable.  There is no evidence of nasopharyngeal mass.  No 
lytic or blastic lesions.
IMPRESSION: No evidence of acute sinusitis. Onacram, Don Lolito Date:  06/29/2007 DAS  JLM

## 2010-03-18 ENCOUNTER — Encounter: Payer: Self-pay | Admitting: Family Medicine

## 2010-03-18 ENCOUNTER — Encounter: Payer: Self-pay | Admitting: Hematology & Oncology

## 2010-03-25 LAB — CONVERTED CEMR LAB
Basophils Absolute: 0 10*3/uL (ref 0.0–0.1)
CO2: 29 meq/L (ref 19–32)
Calcium: 9.3 mg/dL (ref 8.4–10.5)
Cholesterol: 249 mg/dL — ABNORMAL HIGH (ref 0–200)
Creatinine, Ser: 0.6 mg/dL (ref 0.4–1.2)
Direct LDL: 113.3 mg/dL
Eosinophils Absolute: 0.1 10*3/uL (ref 0.0–0.7)
Glucose, Bld: 105 mg/dL — ABNORMAL HIGH (ref 70–99)
HCT: 39 % (ref 36.0–46.0)
Lymphs Abs: 2.7 10*3/uL (ref 0.7–4.0)
MCV: 89.6 fL (ref 78.0–100.0)
Monocytes Absolute: 0.5 10*3/uL (ref 0.1–1.0)
Platelets: 272 10*3/uL (ref 150.0–400.0)
RDW: 12 % (ref 11.5–14.6)
Sodium: 137 meq/L (ref 135–145)
Total Bilirubin: 0.8 mg/dL (ref 0.3–1.2)
Total CHOL/HDL Ratio: 8
Triglycerides: 444 mg/dL — ABNORMAL HIGH (ref 0.0–149.0)
VLDL: 88.8 mg/dL — ABNORMAL HIGH (ref 0.0–40.0)

## 2010-03-30 NOTE — Letter (Signed)
Summary[facility]   [facility]   Imported By: Roderic Ovens 03/20/2009 12:48:59  _____________________________________________________________________  External Attachment:    Type:   Image     Comment:   External Document

## 2010-05-14 LAB — BASIC METABOLIC PANEL
CO2: 26 mEq/L (ref 19–32)
Calcium: 9 mg/dL (ref 8.4–10.5)
Creatinine, Ser: 0.74 mg/dL (ref 0.4–1.2)
GFR calc Af Amer: >60 mL/min (ref >60)
GFR calc non Af Amer: >60 mL/min (ref >60)
Sodium: 137 mEq/L (ref 135–145)

## 2010-05-14 LAB — D-DIMER, QUANTITATIVE: D-Dimer, Quant: <0.22 ug/mL-FEU (ref 0.00–0.48)

## 2010-05-14 LAB — PROTIME-INR
INR: 1.89 — ABNORMAL HIGH (ref 0.00–1.49)
Prothrombin Time: 21.5 seconds — ABNORMAL HIGH (ref 11.6–15.2)

## 2010-05-14 LAB — CBC
MCHC: 34.1 g/dL (ref 30.0–36.0)
RBC: 4.88 MIL/uL (ref 3.87–5.11)

## 2010-05-14 LAB — HEMOCCULT GUIAC POC 1CARD (OFFICE): Fecal Occult Bld: NEGATIVE

## 2010-05-17 ENCOUNTER — Other Ambulatory Visit: Payer: Self-pay | Admitting: Hematology & Oncology

## 2010-05-17 ENCOUNTER — Encounter (HOSPITAL_BASED_OUTPATIENT_CLINIC_OR_DEPARTMENT_OTHER): Payer: Self-pay | Admitting: Hematology & Oncology

## 2010-05-17 DIAGNOSIS — Z86718 Personal history of other venous thrombosis and embolism: Secondary | ICD-10-CM

## 2010-05-17 DIAGNOSIS — D6859 Other primary thrombophilia: Secondary | ICD-10-CM

## 2010-05-17 DIAGNOSIS — Z7901 Long term (current) use of anticoagulants: Secondary | ICD-10-CM

## 2010-05-17 DIAGNOSIS — M129 Arthropathy, unspecified: Secondary | ICD-10-CM

## 2010-05-17 LAB — CBC WITH DIFFERENTIAL (CANCER CENTER ONLY)
BASO#: 0 10*3/uL (ref 0.0–0.2)
EOS%: 1.9 % (ref 0.0–7.0)
HCT: 41.6 % (ref 34.8–46.6)
HGB: 14.4 g/dL (ref 11.6–15.9)
MCH: 29.5 pg (ref 26.0–34.0)
MCHC: 34.6 g/dL (ref 32.0–36.0)
MONO%: 8.1 % (ref 0.0–13.0)
NEUT%: 50.8 % (ref 39.6–80.0)
RDW: 13.3 % (ref 11.1–15.7)

## 2010-05-17 LAB — PROTIME-INR (CHCC SATELLITE): INR: 1.7 — ABNORMAL LOW (ref 2.0–3.5)

## 2010-06-04 ENCOUNTER — Other Ambulatory Visit: Payer: Self-pay | Admitting: *Deleted

## 2010-06-04 MED ORDER — ALBUTEROL SULFATE (2.5 MG/3ML) 0.083% IN NEBU: 2.5000 mg | INHALATION_SOLUTION | RESPIRATORY_TRACT | Status: AC | PRN

## 2010-07-10 NOTE — Op Note (Signed)
[name], [name] NO.:  [id number]   MEDICAL RECORD NO.:  [id number]          PATIENT TYPE:  INP   LOCATION:  1527                         FACILITY:  [facility]   PHYSICIAN:  Ardeth Sportsman, MD     DATE OF BIRTH:  [birth year]   DATE OF PROCEDURE:  10/07/2007  DATE OF DISCHARGE:                               OPERATIVE REPORT   PRIMARY CARE PHYSICIAN:  Dr. Laurann Montana.   HOSPITALIST:  Dr. Suanne Marker with [facility].   SURGEON:  Dr. Karie Soda.   ASSISTANT:  Dr. Almond Lint.   PREOPERATIVE DIAGNOSIS:  Biliary dyskinesia, possible chronic  cholecystitis.   POSTOPERATIVE DIAGNOSES:  1. Biliary dyskinesia.  2. Cholecystitis, acute on chronic.   PROCEDURE PERFORMED:  Laparoscopic cholecystectomy with intraoperative  cholangiogram.   ANESTHESIA:  1. General anesthesia.  2. Local anesthetic in a field block at all port sites.   SPECIMEN:  Gallbladder.   DRAINS:  None.   ESTIMATED BLOOD LOSS:  30 mL.   COMPLICATIONS:  None apparent.   INDICATIONS:  [name] is a 55 year old morbidly obese female with  intermittent abdominal pain that became more severe.  She had an outside  hospital ultrasound which showed some gallbladder wall thickening.  When  she came the emergency room, she had a HIDA scan that showed no definite  cholecystitis.  Her pain was a little atypical.  She had a CAT scan that  argued against cholecystitis with normal-appearing gallbladder, no  evidence of bowel obstruction or other abnormalities, though there is  some question of mild colitis.  Because her pain seemed to be a little  more focal, we went ahead and repeated a HIDA scan with gallbladder  ejection fraction.  She had decreased gallbladder ejection fraction  around 10% with some reproduction of symptoms.  Given the rest of workup  was otherwise negative, recommendation was made for laparoscopic  cholecystectomy with intraoperative cholangiogram.  The anatomy and  physiology of hepatobiliary pancreatic function was discussed.  Pathophysiology of biliary dyskinesia and chronic cholecystitis were  discussed.  Differential diagnosis was discussed.  Cardiopulmonary and  other etiologies were ruled out.  Given the fact  that patient had a  pretty good workup, I thought surgery would be reasonable.  She  understands this may not work.  Risks, benefits, and alternatives are  discussed in detail.  Questions answered.  She agreed to proceed.   OPERATIVE FINDINGS:  She had some mild gallbladder wall thickening with  some ischemia to her gallbladder wall.  She had acute dense omental  adhesions.  Her cholangiogram was otherwise normal.   DESCRIPTION OF PROCEDURE:  Informed consent was confirmed.  The patient  received IV ciprofloxacin and Flagyl around the time surgery.  She has  severe penicillin and other allergies, and therefore, that is why that  was given.  She had sequential compression devices active during the  entire case.  She was positioned supine with both arms tucked.  Her  abdomen was prepped and draped in sterile fashion.   A 5-mm port was placed in the right upper quadrant using optical  entry  technique with the patient in steep reversed Trendelenburg right side  up.  Camera inspection revealed no intra-abdominal injury.  Under direct  visualization, final ports were placed in the right flank and through  the superior part of the umbilicus.  A 10-mm port was placed in the  subxiphoid region at an angle and tunneled through the falciform  ligament.   Gallbladder fundus was grasped and elevated cephalad.  The gallbladder  was also entirely covered with greater omentum.  This was gradually  freed off using careful dissection and focused cautery.  Peritoneal  coverings between the anteromedial and posterior walls of the  gallbladder and liver were freed off.  Circumferential dissection was  done to free the proximal third of the gallbladder  off the liver bed.  A  good critical view of 2 tubular structures going around from the  gallbladder down to the porta hepatis.  One clip on the gallbladder side  and 2 clips proximally were made on the cystic artery, and this was  transected.  There was a thinner posterior cystic arterial branch that  was cauterized off the gallbladder.   This left one structure going from the gallbladder down the porta  hepatis consistent with cystic duct.  One clip was made on the cystic  duct just as it left the infundibulum.  A partial cystotomy was  performed.  A 5-French cholangiocatheter was placed through right  subcostal stab incision flush and placed in the cystic duct.  A  cholangiogram was run using diluted radio-opaque contrast and continuous  fluoroscopy.  Contrast flowed well from a side branch consistent with a  cystic duct.  It came around and actually entered in the common hepatic  duct from the medial aspect.  Contrast refluxed well into the right and  left intrahepatic chains and flowed down the common bile duct, across a  normal ampulla into duodenum consistent with a normal cholangiogram.  Cholangiocatheter was removed, and 4 clips were placed in the cystic  duct since it was rather long.  Cystic duct transection was completed.   Gallbladder was freed from its main attachments on the liver bed.  There  was a breach in the gallbladder wall, and there was some spillage of  thick bile but no stones.  Gallbladder was placed inside an EndoCatch  bag and pulled out through the subxiphoid port with no dilation needed.  Fascial defect in the subxiphoid region was too small to allow my pinky  to pass.  Therefore, I felt it did not require a more aggressive  closure.  Copious irrigation over 3 liters was done with clear return,  and hemostasis was assured on the omentum and liver bed.  Clips were  intact on the cystic duct and arterial stumps.  Capnoperitoneum was  actively evacuated, and  ports removed.  Skin was closed using 4-0  Monocryl stitch.  Sterile dressings applied.   The patient was extubated and sent to the recovery room in stable  condition.  There are no family members that she wishes me to talk to  me.  She wishes to talk to me in the PACU and discuss postoperative  findings with her, although she is just barely awake.  She expressed  appreciation.      Ardeth Sportsman, MD  Electronically Signed     SCG/MEDQ  D:  10/07/2007  T:  10/07/2007  Job:  [id number]   cc:   Stacie Acres. Cliffton Asters, M.D.  Fax: [phone]  Kela Millin, M.D.

## 2010-07-10 NOTE — Consult Note (Signed)
NAMEARYAN, BELLO NO.:  [id number]   MEDICAL RECORD NO.:  [id number]          PATIENT TYPE:  INP   LOCATION:  1527                         FACILITY:  [facility]   PHYSICIAN:  Ardeth Sportsman, MD     DATE OF BIRTH:  Apr 04, 1955   DATE OF CONSULTATION:  DATE OF DISCHARGE:                                 CONSULTATION   PRIMARY CARE PHYSICIAN:  Stacie Acres. Cliffton Asters, M.D.   HEMATOLOGIST:  Rose Phi. Myna Hidalgo, M.D.   GASTROENTEROLOGIST:  Dr. Tania Ade in [place].   REASON FOR CONSULT:  Abdominal pain, question cholecystitis.   HISTORY:  [name] is a 55 year old female with numerous health issues,  including reactive air disease; question of chronic obstructive  pulmonary disease versus asthma; prothrombin gene mutation (factor II  deficiency), with recurrent DVTs and hypercoagulable state, on chronic  anticoagulation; iron deficiency anemia; B12 anemia; anxiety; bipolar  disorder; tobacco abuse; hyperlipidemia; interstitial cystitis;  Helicobacter pylori treatment past; sleep apnea, question of CPAP use;  irritable bowel syndrome; migraines; questionable hyperthyroidism in the  past.   She noted that she had worsening abdominal pain about 5 days ago.  She  went and saw Dr. Laurann Montana, her primary care physician, who  recommended an ultrasound.  This was done 3 days ago.  She waited over  the weekend, with worsening symptoms.  She transitioned herself over  just to liquids only and held her Coumadin in anticipation of  gallbladder being the most likely etiology of her problems.  She called  again today, complaining the worst pain and poor p.o. tolerance, with  worsening nausea.  Her primary care physician recommended she go to the  emergency room for evaluation.  The emergency room called me and  wondered what to do.  I recommended getting a HIDA scan to try to work  things out and to complete their workup and let me know.  They request a  formal consultation.   The patient notes that she has pain in her right upper abdomen,  especially in her right side.  It occasionally goes to her back and her  shoulders and her left upper quadrant.  She normally has good p.o.  tolerance, without any problems with fatty or spicy foods.  She is  transitioned off fatty foods to just liquids only.  It seems to be  related that she gets nauseated after she has something to eat.   She denies any significant history of heartburn or reflux.  She can bend  over fine.  She can lie down flat without any difficulty.  She denies  any hypereructation or hyperflatulence.  She does think she has some  mild dysphagia every once in a while, but it does not seem consistent  with temperature or with consistency.  No hematemesis, melena, or  hematochezia.  She claims she has irritable bowel syndrome, but averages  about 1 bowel movement a day, although occasionally she can have some  issues with constipation and diarrhea.  She has been followed by a  gastroenterologist in [place],  Dr. Tania Ade.  She denies any sick  contacts  or travel history.  She does not recall having anything like  this before.   PAST MEDICAL HISTORY:  Reactive air disease; question of chronic  obstructive pulmonary disease versus asthma; prothrombin gene mutation  (factor II deficiency), with recurrent DVTs and hypercoagulable state,  on chronic anticoagulation; iron deficiency anemia; B12 anemia; anxiety;  bipolar disorder; tobacco abuse; hyperlipidemia; interstitial cystitis;  Helicobacter pylori treatment past; sleep apnea, question of CPAP use;  irritable bowel syndrome; migraines; questionable hyperthyroidism in the  past.  .   PAST SURGICAL HISTORY:  1. She had a C-section 16 years ago.  2. She has had a D&C done  in 2005.  3. She had a PPH hemorrhoidectomy for internal hemorrhoids in 2007 by      Dr. Carolynne Edouard.   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  She lives at home.  She has  maybe about a 40 pack-year history of tobacco, but rarely smokes now.  She is not able to completely stop smoking, but she claims she has not  done it for quite some time, especially with a recent admission back in  June 2009 for a respiratory deterioration.   FAMILY HISTORY:  Both parents had COPD, otherwise negative.   MEDICATIONS:  She is on Combivent, Coumadin, Lipitor, Neurontin, Xanax,  Zoloft, Klonopin, and Restoril.   ALLERGIES:  1. AVELOX.  2. PENICILLINS.  3. SULFA.  4. LOVENOX; she had severe subcutaneous and skin reaction; she is      allergic to that as well, but she can handle IV heparin fine.   REVIEW OF SYSTEMS:  As noted per HPI, and is quite positive.  GENERAL:  She denies really any fevers, chills, or sweats.  No recent weight gain  or weight loss.  Eyes:  No major visual or auditory changes.  ENT:  Mild  dysphagia, as noted above, otherwise negative. CARDIAC:  She claims she  can walk a few blocks before she stops secondary to shortness of breath,  but no exertional chest pain or neck pain or arm pain.  RESPIRATORY:  No  recent colds, coughs, or flus, or exacerbations since her admission on  August 05, 2007.  She says that they gave her a Z-PAK, and that seemed to  help clear things up okay once she was discharged.  GI: As noted above.  NEUROLOGIC:  No recent lightheadedness or  dizziness.  PSYCHIATRIC:  No  recent flares.  No severe manic or depressive episodes, still borderline  anxiety, and she is monitored for that.  BREASTS:  Negative.  GYN:  Negative.  GU: Negative.  BACK/RENAL/ENDOCRINE:  Otherwise negative.   PHYSICAL EXAMINATION:  VITAL SIGNS:  Her temperature:  99.8 was her T-  max, pulse 107 initially, respirations 18; now, her temperature is 98.4,  pulse 84, respirations 20.  Pain was around 7-8/10, and it has gone down  around to 5/10, although she has had numerous doses of IV  antibiotics.  Her saturation has been 95% on room air.  GENERAL:  She is a well-developed, well-nourished obese female,  initially sitting up, smiling, in no acute distress, though later  complaining of some pain and soreness and discomfort.  PSYCHIATRIC:  She is talkative, but pleasant, interactive.  No word  salad or pressured speech.  No strong evidence of mania or depression.  She does tend to be quite talkative and go off on tangents but is  easily  redirectible.  NEUROLOGIC:  Cranial nerves II-XII are intact.  Hand grips 5/5, equal  and symmetrical.  No resting or intention tremors.  Pupils equal, round,  and reactive to light.  Extraocular movement intact.  Sclerae are  nonicteric or injected.  NECK:  Supple.  No masses.  Trachea is midline  HEENT:  She is normocephalic.  No facial asymmetry.  She does have a  little bit of boggy under eyelids, like she has lacked a little bit of  sleep, but she is otherwise alert.  Mucous membranes are dry.  Nasopharynx and oropharynx clear.  CHEST:  Clear to auscultation bilaterally.  No wheezes, rales, or  rhonchi.  Maybe some mild discomfort to right lateral chest wall  compression, but no step-offs, no pain to sternal compression.  HEART:  Regular rate and rhythm.  No obvious murmurs, clicks, or rubs.  No carotid bruits.  Normal radial pulses.  ABDOMEN:  Obese, but very soft.  She has some mild discomfort in her  right upper quadrant, kind of in the midclavicular line, and also a  little bit laterally, especially when you do deep pressure underneath  her rib.  It is not Reproduced on the left side.  She does not have a  Murphy's sign.  No obvious focal hernias.  GENITOURINARY:  Normal external female genitalia.  No inguinal hernias.  RECTAL:  Deferred.  EXTREMITIES:  No obvious clubbing, cyanosis, or edema.  MUSCULOSKELETAL:  Seems to have good range of motion in shoulders,  elbows, wrists, hips, knees, and ankles.  SKIN:  No petechiae.   No other sores or lesions.  BREASTS:  Deferred.  LYMPHATIC:  No obvious head, neck, axillary, or groin lymphadenopathy.   LABORATORY VALUES:  Her white count is 5.9, with no left shift.  Hemoglobin is 13.8.  Liver function tests are completely normal.  Her  INR is 1.2, which is subtherapeutic.  Her urinalysis shows some trace  leukocyte esterase, but no nitrates, and only 0-2 white blood cells,  otherwise rather negative.   I have an ultrasound that shows gallbladder wall thickening to 12 mm on  ultrasound 3 days ago, but no ductal dilatation, no pericholecystic  fluid, no gallstones.  She has prior ultrasounds where no gallbladder  wall thickening was noted a few years ago, but no stones.  She had a  HIDA scan back in 2003 which showed a gallbladder ejection fraction of  51%.   ASSESSMENT AND PLAN:  A 55 year old female with numerous abdominal and  health issues, with story suspicious for biliary dyskinesia.  1. HIDA scan to rule out cholecystitis.  If her HIDA scan is positive,      then I would more urgently operate on her.  2. If her HIDA scan is negative for obstruction, get a gallbladder      ejection fraction.  If that is completely normal, then I would just      get her a CT scan and rule out other etiologies first and make sure      that this gallbladder wall thickening has persisted or worsened.      The radiologist does not feel that it is suspicious for any      malignancy, although I do not have the films in front of me.  3. Chest x-ray, PA and lateral, to make sure there are no pulmonary      issues since she just was admitted 2 months ago for pulmonary  issues.  4. I discussed the call Dr. Ruthann Cancer, who was covering for      hematoligist, Arlan Organ, M.D.  If she is being admitted, and I      think medicine probably is admitting her to help sort this thing,      he recommended IV heparin to cover her and then hold it      perioperatively should she need  a laparoscopic cholecystectomy, and      then gradually transition back to her oral Coumadin.  5. Probable laparoscopic cholecystectomy if her HIDA scan is positive      for cholecystitis and/or other etiologies have been ruled out.  The      anatomy and physiology of hepatobiliary and pancreatic function      were discussed.  Pathophysiology of biliary dyskinesia with the      risks of cholecystitis, worsening nausea, vomiting, and pain worse      cussed.  Options were discussed, and technique of laparoscopic      cholecystectomy with intraoperative cholangiogram was discussed.      Risks, benefits, and alternatives were discussed.  If her workup is      otherwise negative, she could benefit from cholecystectomy, but      would like to complete her workup and sort things out first.  6. Consider medicine evaluation for her other issues that are going      on, especially her hypercoagulable state.      Ardeth Sportsman, MD  Electronically Signed    SCG/MEDQ  D:  10/05/2007  T:  10/06/2007  Job:  [id number]

## 2010-07-10 NOTE — Assessment & Plan Note (Signed)
[facility]                            CARDIOLOGY OFFICE NOTE   Yolanda, Thomas                     MRN:          [id number]  DATE:11/15/2008                            DOB:          05/16/1955    [name] comes in today for followup.   Her chief complaint today is that of just generalized fatigue.  She  denies any chest pain, orthopnea, PND, peripheral edema.  Her weight is  up 10 pounds.  She still smokes a half pack of cigarettes a day which is  better than the past.  She seems to be fairly compliant with her  medications and somehow is still on pravastatin.   She is due a follow up with Dr. Myna Hidalgo at the [facility] tomorrow  for her factor VIII deficiency.   Her other problems include mixed hyperlipidemia.  She was intolerant to  LIPITOR, but has been able to take Pravachol 40 mg a day.  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  She has a history of a deep vein thrombosis.   She denies any bleeding or melena.   CURRENT MEDICATIONS:  1. Neurontin 800 mg 2 by mouth at bedtime.  2. Zoloft 100 mg 2 by mouth at bedtime.  3. Limbitrol 5/12.5 mg b.i.d.  4. Coumadin 5 mg as directed.  5. Flonase 50 mcg active suspension as directed.  6. Advair Diskus 100/50, one puff 2 times a day.  7. Pravastatin 40 mg at bedtime.  8. Xanax 2 mg as needed.  9. Restoril 15 mg 1-2 caps at bedtime.  10.Combivent inhaler as needed.  11.Narco 10/350 as needed.  12.Phenergan 25 mg solution as needed.   ALLERGIES:  She is allergic to PENICILLIN, SULFA, and LOVENOX.   REVIEW OF SYSTEMS:  Negative other than HPI.   PHYSICAL EXAMINATION:  VITAL SIGNS:  Her blood pressure today is 90/70  which is about normal for her, heart rate is 77 and regular.  Her EKG  demonstrates sinus rhythm with possible left atrial enlargement.  She is  66 inches,  weighs 248 pounds.  BMI is 40.17.  HEENT:  Unremarkable except sclerae injected.  NECK:  Supple.  Carotid upstrokes were equal bilaterally without bruits.  No JVD.  Thyroid is not enlarged.  Trachea is midline.  LUNGS:  Clear to auscultation and percussion.  HEART:  Poorly appreciated PMI.  Soft S1 and S2.  No murmur, rub, or  gallop.  ABDOMEN:  Soft, good bowel sounds.  No midline bruit.  EXTREMITIES:  No cyanosis, clubbing, or edema.  Pulses are intact.  NEUROLOGIC:  Intact.   ASSESSMENT AND PLAN:  [name] is pretty much status quo in having  gained another 10 pounds, probably worse off than she was last here when  I saw her.  She is smoking a little bit less.  She is not very active.  She does seem to be compliant with her medications.  Because of fatigue,  I am going to obtain TSH,  CBC, particularly on Coumadin.  We will check  a protime to prevent another stick at the [facility] tomorrow.  We  will also check a CMET, lipids though they are not fasting since she has  not had them checked in a couple years, and a B12 level per her request.   Assuming she is stable, I will see her back in a year.     Thomas C. Daleen Squibb, MD, [facility]  Electronically Signed    TCW/MedQ  DD: 11/15/2008  DT: 11/16/2008  Job #: [id number]

## 2010-07-10 NOTE — Discharge Summary (Signed)
[name], [name]              ACCOUNT NO.:  [id number]   MEDICAL RECORD NO.:  [id number]          PATIENT TYPE:  INP   LOCATION:  1427                         FACILITY:  [facility]   PHYSICIAN:  [name], MDDATE OF BIRTH:  02-28-1955   DATE OF ADMISSION:  08/05/2007  DATE OF DISCHARGE:  08/07/2007                               DISCHARGE SUMMARY   PRIMARY CARE PHYSICIAN:  [name], M.D.   DISCHARGE DIAGNOSES:  1. Asthma/chronic obstructive pulmonary disease exacerbation.  2. Iron deficiency anemia.  3. B-12 deficiency anemia.  4. Anxiety.  5. 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  6. Tobacco abuse.  7. Hyperlipidemia.  8. History of internal hemorrhoids.  9. Interstitial cystitis.  10.History of bipolar disorder.  11.History of Helicobacter infection status post treatment.  12.Sleep apnea.  13.Irritable bowel syndrome.  14.Migraines.  15.Questionable hyperthyroidism.   STUDIES:  Chest x-ray done on August 05, 2007, showing basilar atelectasis  left greater than right, but no new infiltrates.  ABG:  PH 7.388, pCO2  39.1, pO2 71.8, Thomas blood cell count 7.1, hemoglobin 11.1, INR at the  time of discharge 1.8.  Total cholesterol 179, LDL 95, HDL 65.  Thyroid  stimulating hormone 0.226.  Total iron 24, iron saturation 7, ferritin  94, B-12 257.  UA negative for infection, occult blood negative.   HOSPITAL COURSE:  The patient is a 55 year old female who presented with  shortness of breath.  Please see full H and P for details; but briefly  she was seen by ED four days before with wheezing and shortness of  breath and was sent home.  Then persisted to have the symptoms and  presented to the primary care [name], was sent home on a prednisone  taper and then still called EMS because she was still short of breath.  She was admitted by [facility] hospitalist for further observation.  Was  started on high dose steroids, Advair, provided  nebulizer education on  asthma.  Given a flow meter.  Prescription for DuoNeb and started on  Atrovent t.i.d.  Will probably need PFTs as an outpatient to evaluate  for underlying COPD given extensive history of smoking.  Will probably  benefit from pulmonary evaluation, especially if PFTs show evidence of  obstructive disease.   Regarding her anxiety, have changed her Klonopin to 1 mg at 2 p.m. and 2  mg at bedtime to avoid taking large doses at one time.  Also informed  the patient that she is to be on Klonopin not p.r.n., but rather  consistently and use Xanax for break through anxiety events.  Try to  avoid overusing Xanax.   The patient's B-12 was noted to be on the low side.  Given that she is  on Coumadin for history of DVT, we will not give IM shots, but instead  replace p.o.   History of iron deficiency, likely secondary to polyp which was removed  2 weeks ago by colonoscopy.  Will give iron and have patient check her  hemoglobin level and anemia panel in about 1 month.   TSH  was noted to be low.  I have called [name] office and informed  her about my referral of [name] to [name] for followup of  potential hyperthyroidism.   DISCHARGE MEDICATIONS:  1. Combivent every 4 hours as needed.  2. Coumadin 10 mg p.o. daily and 12.5 mg p.o. today.  The patient is      to have her INR checked and send results to [name] on Monday      the 15th.  3. Lipitor 10 mg p.o. daily.  4. Neurontin 800 mg p.o. b.i.d.  5. Prednisone taper 40 mg x3 days, then 30 mg x3 days, then 20 mg x3      days, then 10 mg x3 days and then stop.  6. Xanax 2 mg daily as needed for severe anxiety.  7. Zoloft 100 mg p.o. daily.  8. Klonopin 1 mg p.o. at 2 p.m. and then 2 mg p.o. at bedtime.  9. Restoril 15 mg p.o. at bedtime p.r.n. insomnia.  10.Advair 100/50 inhaled b.i.d.  Wash out mouth after use.  11.Atrovent inhaler t.i.d.  12.DuoNeb q.4 hours p.r.n. severe wheezing.  No shortness of  breath.      The patient is to contact physician is requires nebulizer      treatments more than once a day.  Use daily peak reader and call      physician if in the red zone.  13.Zithromax 250 mg p.o. x3 days for chronic obstructive pulmonary      disease.  14.B-12 1000 mcg p.o. daily x1 week, then every week.  Do check a B-12      level in about one month.  15.Ferrous gluconate 325 mg p.o. daily.  16.The patient is to use over the counter Colace as needed.      Yolanda Cowboy, MD  Electronically Signed     AVD/MEDQ  D:  08/07/2007  T:  08/07/2007  Job:  [id number]   cc:   [name], M.D.  Fax: [phone]   [name], M.D.

## 2010-07-10 NOTE — Discharge Summary (Signed)
[name], [name]              ACCOUNT NO.:  [id number]   MEDICAL RECORD NO.:  [id number]          PATIENT TYPE:  INP   LOCATION:  1527                         FACILITY:  [facility]   PHYSICIAN:  Corinna L. Lendell Caprice, MDDATE OF BIRTH:  1955/11/29   DATE OF ADMISSION:  10/05/2007  DATE OF DISCHARGE:  10/09/2007                               DISCHARGE SUMMARY   DISCHARGE DIAGNOSES:  1. Biliary dyskinesia and acute on chronic cholecystitis.  2. 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  3. Bipolar disorder.  4. Interstitial cystitis.  5. Tobacco abuse, counseled against.  6. Chronic obstructive pulmonary disease.  7. Migraines.  8. Irritable bowel syndrome.  9. Sleep apnea, noncompliant with CPAP.  10.History of iron deficiency and B12 deficiency anemia.   DISCHARGE MEDICATIONS:  1. Dilaudid 2 mg 1-2 every 4 hours as needed for pain, 20 were      dispensed.  2. Phenergan 25 mg 1 p.o. every 6 hours p.r.n. nausea, 20 were      dispensed.  Resume outpatient medications which include:  1. Combivent 2 puffs every 4 hours as needed for wheeze.  2. Coumadin 7.5 mg daily.  3. Lipitor 10 mg daily.  4. Neurontin 800 mg p.o. b.i.d.  5. Xanax 1-2 mg as needed.  6. Zoloft 200 mg daily.  7. Klonopin 1 mg p.o. q.i.d.  8. Restoril 30 mg nightly.  9. Advair 1 puff twice daily.  10.Nasacort 2 puffs daily.  11.Albuterol nebulizer 4 times daily as needed for wheeze.   CONDITION:  Stable.   ACTIVITY:  No heavy lifting for 2 weeks.  No driving while on pain  medications.   FOLLOW UP:  1. With Dr. Michaell Cowing in 2 weeks.  2. With [facility] next week.   CONSULTATIONS:  1. Ardeth Sportsman, M.D.  2. Rose Phi. Myna Hidalgo, M.D.   PROCEDURES:  Laparoscopic cholecystectomy.   DIET:  As tolerated.   PERTINENT LABORATORY DATA:  CBC unremarkable.  INR on admission 1.2, PTT  39.  Complete metabolic panel unremarkable.  Lipase normal, hemoglobin  A1c 5.2, LDL 113, triglycerides 158,  HDL 32, total cholesterol 161,  ferritin 107, iron 98, TIBC 298, B12 of  200, folate 12.  Urinalysis:  Specific gravity 1.016, negative blood, negative protein, negative  nitrite, negative leukocyte esterase, negative ketones.  Hemoccult of  the stool was negative.   SPECIAL STUDIES/RADIOLOGY:  EKG showed normal sinus rhythm.  Chest x-ray  showed mild peribronchial thickening, atelectasis.  HIDA scan with  ejection fraction showed ejection fraction of 10%.  CT of the abdomen  showed no evidence of acute cholecystitis, colonic mucosal thickening in  hepatic flexure consistent with segmental colitis. Gallbladder,  nondistended, small amount of sludge versus stones.  No pericholecystic  fluid.  Intraoperative cholangiogram showed elongated cystic duct.   HISTORY AND HOSPITAL COURSE:  [name] is a 55 year old white female  with multiple medical problems who had right upper quadrant pain with  vomiting.  She took herself off Coumadin prior to admission because she  was convinced it was her gallbladder.  She presented to the  emergency  room and was seen by Surgery and admitted to Internal Medicine.  Initial  HIDA scan without ejection fraction was normal, but with ejection  fraction showed low EF.  She continued to have pain and had a  laparoscopic cholecystectomy, which showed acute on chronic  cholecystitis per the surgeon's report.  Pathology report shows chronic  cholecystitis.  During her hospitalization the patient had problems with  pain, anxiety, nausea.  She was started on heparin without any bleeding  problems and subsequently started on Coumadin.  She has been cleared for  discharge by both Hematology/Oncology and Surgery.  She is tolerating a  diet.  She has many concerns which have been addressed.  The surgeon had  written a prescription for Percocet.  Patient request instead Dilaudid  as she feels Percocet makes her nauseated. I have ripped up the Percocet  prescription and  given her a prescription for Dilaudid and Phenergan.  As she is tolerating a diet, ambulating the halls with normal vital  signs, she is clear for discharge.   Total time on the day of discharge is 45 minutes.      Corinna L. Lendell Caprice, MD  Electronically Signed     CLS/MEDQ  D:  10/09/2007  T:  10/09/2007  Job:  [id number]   cc:   Ardeth Sportsman, MD  [address] [place] [address]   Rose Phi. Myna Hidalgo, M.D.  Fax: [phone]   Stacie Acres. Cliffton Asters, M.D.  Fax: [phone]

## 2010-07-10 NOTE — Assessment & Plan Note (Signed)
[facility]                            CARDIOLOGY OFFICE NOTE   Yolanda, Thomas                     MRN:          [id number]  DATE:12/14/2007                            DOB:          [birth year]    [name] comes in today for followup.   PROBLEM LIST:  1. Mixed hyperlipidemia.  She was having aches in her ankles and her      calves and stopped her Lipitor.  She was on 10 a day.  She says it      got better within a couple of days.  2. Obese.  3. Sedentary lifestyle.  4. Tobacco use.  She is still smoking about a pack a day.  5. Family history of coronary artery disease.  6. Factor VIII 5111 deficiency on Coumadin.  She does have a history      of deep vein thrombosis.   She has had a very difficult summer.  She was hospitalized for acute  cholecystitis and had a cholecystectomy.   She denies any angina or ischemic symptoms.  She has had no orthopnea or  PND.   CURRENT MEDICATIONS:  1. Neurontin 1600 mg nightly.  2. Zoloft 200 mg nightly.  3. Limbitrol 25 mg daily.  4. Coumadin as directed.  5. Klonopin 1 mg q.i.d.  6. Advair and Nasacort inhalers.  7. CPAP at night.   PHYSICAL EXAMINATION:  VITAL SIGNS:  Her blood pressure today is 117/70,  her pulse is 98 and regular.  Her electrocardiogram is unchanged.  She  weighs 262 pounds which is up 24 since last September.  HEENT:  Sclerae are injected, otherwise negative.  Carotid upstrokes  were equal bilaterally without bruits, no JVD.  Thyroid is not enlarged.  Trachea is midline.  LUNGS:  Clear to auscultation and percussion except for expiratory  wheezes.  HEART:  Poorly appreciated PMI.  Normal S1 and S2.  No gallop or rub.  ABDOMEN:  Obese with good bowel sounds.  Organomegaly could not be  assessed.  EXTREMITIES:  No edema.  Pulses are intact.  There is no sign of knee or  joint or ankle effusions.   I had a long talk today with [name].  I strongly feel that she is not  being able to take a statin, it is a liability to her future  cardiovascular as well as cerebrovascular health.  I have prescribed  pravastatin 40 mg nightly.  Followup blood work obtained in 6 weeks.  Hopefully, she can take this.  I have encouraged her to stop smoking.  I  have encouraged her to get her weight down.   I will plan to see her back again in 6 months.     Thomas C. Daleen Squibb, MD, [facility]  Electronically Signed    TCW/MedQ  DD: 12/14/2007  DT: 12/15/2007  Job #: [id number]   cc:   Julio Sicks, N.P.

## 2010-07-10 NOTE — H&P (Signed)
[name], [name] NO.:  [id number]   MEDICAL RECORD NO.:  [id number]          PATIENT TYPE:  EMS   LOCATION:  ED                           FACILITY:  [facility]   PHYSICIAN:  Michiel Cowboy, MDDATE OF BIRTH:  Aug 16, 1955   DATE OF ADMISSION:  08/05/2007  DATE OF DISCHARGE:                              HISTORY & PHYSICAL   CHIEF COMPLAINT:  Shortness of breath and wheezing.   Patient is a 55 year old female with a history of asthma/COPD who  presented to the emergency department four days ago with complaints of  shortness of breath and wheezing.  Received breathing treatments and  prednisone and was discharged to home.  Had followup with her primary  care Victoriana Aziz two days ago and then again yesterday with similar  complaints, now again back to the emergency department with similar  complaints of wheezing and shortness of breath.  Per patient, she has  been dealing with a lot of stress lately involving her family and unsure  what exactly had stimulated her asthma but thinks it is somehow stress-  related.  She has also been having some low grade fevers and chills and  has been coughing up clear-to-yellowish sputum.  Patient is a smoker but  has not been able to smoke due to shortness of breath for the past 2-3  days.  She describes frequent asthma attacks as sudden onset of  shortness of breath and occasional wheezing but mostly just severe  coughing that just does not go away.  Otherwise, review of systems is  unremarkable except for occasional burning urination and headache.   PAST MEDICAL HISTORY:  1. Anxiety.  2. Asthma.  3. Possible history of COPD.  4. History of DVT, on chronic Coumadin for factor A deficiency.  5. Tobacco abuse.  6. Hyperlipidemia.  7. History of internal hemorrhoids.  8. History of interstitial cystitis.  9. History of bipolar.  10.History of H. pylori infection.  11.Sleep apnea.  12.Irritable bowel syndrome.   13.Migraines.   ALLERGIES:  1. AVELOX.  2. LOVENOX.  3. PENICILLIN.  4. SULFA.   CURRENT MEDICATIONS:  1. Combivent inhaler as needed every few hours.  2. Coumadin 10 mg p.o. daily.  3. Lipitor 10 mg p.o. daily.  4. Neurontin 1600 mg p.o. nightly.  5. Prednisone 60 mg p.o. daily.  6. Xanax 2 mg p.o. as needed.  Patient takes it sometimes every other      day, sometimes daily.  7. Zoloft 200 mg p.o. daily.  8. Klonopin, patient takes it as needed, up to 4 mg p.o. daily.  9. Restoril 50 mg p.o. nightly.  10.Patient has a CPAP on for sleep apnea.   SOCIAL HISTORY:  Patient currently smokes but has not smoked for the  past 2-3 days.  Does not drink or use drugs.  Lives at home.  Has a son  with mental retardation.   FAMILY HISTORY:  Noncontributory except for mother and father with COPD.   PHYSICAL EXAMINATION:  VITALS:  Temperature 100.2, blood pressure 98/63,  pulse 87, respirations 20, satting at 91-93% on room air,  up to 97% on 2  liters.  Patient is an obese female in no acute distress.  Laying down on the  stretcher, breathing comfortably.  No audible wheezes noted.  HEAD:  Atraumatic.  Moist mucous membranes.  LUNGS:  Clear to auscultation bilaterally except for distant breath  sounds.  There is a prolonged expiratory phase before exhaling.  HEART:  Regular rate and rhythm with no murmurs, rubs or gallops.  LOWER EXTREMITIES:  Without edema.  NEUROLOGIC:  Patient is intact.   LABS:  Hemoglobin 11.6, white blood cell count not obtained.  Sodium  138, potassium 4.1, creatinine 0.6, INR on 06/09 was 1.8.  Blood gas:  The pH 7.388, pCO2 39.1, pO2 71.8 on room air, oxygen saturation 95.1.   Chest x-ray showing mild atelectasis, left greater than right.   EKG:  Not obtained.   ASSESSMENT/PLAN:  This is a 55 year old female with asthma/chronic  obstructive pulmonary disease exacerbation.  Given the patient has mild  hypoxemia on room air and has a return bout of severe  shortness of  breath and wheezing, we will admit for observation.  Given shortness of  breath and slightly subtherapeutic Coumadin, we will check D-dimer.  If  positive, will obtain CT of the chest, given the patient's high risk of  pulmonary embolus.  1. Chronic obstructive pulmonary disease/asthma:  I think that      currently, chronic obstructive pulmonary disease is playing a      larger role, given the patient has some yellowish-to-greenish      sputum and low grade fever.  She could have also had a recent upper      respiratory infection that had triggered both chronic obstructive      pulmonary disease and asthma.  We will treat with DuoNeb p.r.n.,      scheduled Atrovent.  Will start on Advair, as patient is allergic      to many antibiotics, will start on Z-Pak for chronic obstructive      pulmonary disease.  Will give prednisone 60 mg p.o. daily and      increase this as needed but currently patient is not wheezing.  We      will make sure the patient has nebulizer when she goes home.  2. Hyperlipidemia:  Check fasting lipid panel in the a.m.  Continue      Lipitor.  3. Anemia:  Check anemia panel.  4. History of deep venous thrombosis.  Continue Coumadin.  Patient      needs to be on chronic anticoagulation secondary to hypercoagulable      state.  Check daily INR.  5. Slightly elevated blood sugars.  Will check hemoglobin A1C.  6. Anxiety:  Will continue home meds but split them to make sure      patient is not over-sedated.  7. Obstructive sleep apnea:  CPAP per respiratory.  8. Low grade fever:  Will check UA plus urine culture.   DISPOSITION:  Admit to telemetry and watch overnight.  Discharge in a.m.      Michiel Cowboy, MD  Electronically Signed     AVD/MEDQ  D:  08/05/2007  T:  08/05/2007  Job:  [id number]   cc:   Stacie Acres. Cliffton Asters, M.D.  Fax: [phone]

## 2010-07-10 NOTE — H&P (Signed)
[name], [name] NO.:  [id number]   MEDICAL RECORD NO.:  [id number]          PATIENT TYPE:  INP   LOCATION:  0102                         [facility]   PHYSICIAN:  Michiel Cowboy, MDDATE OF BIRTH:  June 06, 1955   DATE OF ADMISSION:  10/05/2007  DATE OF DISCHARGE:                              HISTORY & PHYSICAL   PRIMARY CARE PHYSICIAN:  Stacie Acres. Cliffton Asters, M.D.   CONSULTANT SURGEON:  Ardeth Sportsman, MD   CHIEF COMPLAINT:  Right upper quadrant pain.   This is a 55 year old female with history of bipolar disorder, irritable  bowel syndrome, asthma, who presents with a few day history of right  upper quadrant pain that sometimes wakes her up at night, has caused  nausea and vomiting at night, and she has discontinued her Coumadin  which she takes for history of DVTs, because she was convinced that she  needs to have a gallbladder operation done and came in to the emergency  department today hoping to have her gallbladder out.  Reportedly she did  have a right upper quadrant ultrasound performed by her primary care  Lindalou Soltis that showed thickening of the gallbladder wall, but no  gallstones.  She had a HIDA can done today that was negative for acute  cholecystitis.  Chest x-ray did not show any evidence of pneumonia.  She  persists to have right upper quadrant tenderness obtained and has been  admitted for pain management on further radiation.  [facility] hospitalist  called to the admission.   REVIEW OF SYSTEMS:  Negative for fevers or chills.  No chest pain.  Otherwise review of systems as per HPI and otherwise negative.   PAST MEDICAL HISTORY:  Significant for:  1. Asthma/COPD.  2. Iron-deficiency anemia.  3. B12 deficiency anemia.  4. Anxiety.  5. History of DVT with Factor VIII deficiency.  6. History of DVT with clotting factor repression.  7. Tobacco abuse.  8. Hyperlipidemia.  9. History of internal hemorrhoids.  10.Interstitial cystitis.  11.History of bipolar disorder.  12.Sleep apnea.  13.Irritable bowel syndrome.  14.Migraines.  15.Questionable hyperthyroidism.   SOCIAL HISTORY:  The patient still is currently smoking, but does not  drink or use drugs.  Lives at home.  Has a son with mental retardation.   FAMILY HISTORY:  Noncontributory.   ALLERGIES:  TO LOVENOX, ATARAX, PENICILLIN, AND SULFA.   MEDICATIONS:  1. Combivent every 4 hours as needed.  2. Coumadin, the patient had discontinued, but was supposed to be      taking 10 mg daily.  3. Lipitor 10 mg p.o. daily.  4. Neurontin 800 mg p.o. b.i.d.  5. Xanax 2 mg daily.  6. __________100 mg p.o. daily.  The patient states that she is      currently actually taking 200.  7. Klonopin.  The patient states that she takes 2 mg a day during the      day and then 1 mg p.o. nightly.  8. She also takes an additional Restoril 50 mg p.o. at bedtime.  9. Adair 100/50 mg b.i.d. inhaled b.i.d.  10.Atrovent inhaler  t.i.d.   PHYSICAL EXAMINATION:  VITAL SIGNS:  Temperature 99.8.  Blood pressure  122/83.  Pulse 107.  Respirations 18.  Satting 95% on room air.  GENERAL:  Generally obese female in no acute distress.  Talkative.  Head  nontraumatic.  Moist mucous membranes.  LUNGS:  Clear to auscultation bilaterally, but distant breath sounds.  CHEST:  Regular rate and rhythm.  No murmurs, rubs, or gallops.  ABDOMEN:  Obese with right upper quadrant tenderness which is, per  patient, very severe.  LOWER EXTREMITIES:  Edema.  NEUROLOGIC:  Intact.   LABORATORY DATA:  White blood cell count 5.9.  Hemoglobin 13.8.  Platelets 291.  Sodium 138, potassium 4.4, creatinine 0.75, LFTs within  normal limits, lipase 17, UA within normal limits, HIDA scan negative.  Chest x-ray, no evidence of cardiopulmonary disease.  Right colon does  show some thickening of the gallbladder wall, possibly chronic  cholecystitis.   ASSESSMENT AND PLAN:  This is a 55 year old female with right  upper  quadrant pain of unclear etiology.   1. Right upper quadrant pain.  Appreciate surgical consult.  At this      point still not clear of etiology.  Acute cholecystitis seems to be      less likely with negative HIDA scan, but still could be some kind      of chronic cholecystitis process.  Will treat pain for now and      nausea.  Currently no evidence of obstruction.  Will hold off on      antibiotics.  Liver function tests have also been within normal      limits.  Will see how the patient feels in the a.m. and rediscuss      with surgery for the approach at this point.  Will also order      computerized tomography of the abdomen to evaluate for any other      right upper quadrant process that could be causing this pain.  The      ultrasound was within normal limits, but maybe she will help up      evaluate for structures better.  2. History of asthma.  Continue home meds.  3. History of deep venous thrombosis, prothrombin factor abnormality,      followed by hematology.  For right now will give Heparin GGT.  One      it is clear the patient does not need surgical intervention, will      switch hr back to Coumadin.  Will discuss with her hematologist how      long she needs to stay on Coumadin.  4. Prophylaxis Protonix  for Heparin.  5. Of note, the patient had history of B12 deficiency in the past.      Will repeat her B12.  Also the patient had history of elevated TSH.      I referred her in the past to Dr. Cathey Endow.  I am not sure if she went      or not, but will recheck the TSH in a.m. as well.      Michiel Cowboy, MD  Electronically Signed     AVD/MEDQ  D:  10/05/2007  T:  10/06/2007  Job:  [id number]   cc:   Stacie Acres. Cliffton Asters, M.D.  Fax: [phone]   Ardeth Sportsman, MD  [address] [place] [address]

## 2010-07-12 ENCOUNTER — Encounter (HOSPITAL_BASED_OUTPATIENT_CLINIC_OR_DEPARTMENT_OTHER): Payer: Self-pay | Admitting: Hematology & Oncology

## 2010-07-12 ENCOUNTER — Other Ambulatory Visit: Payer: Self-pay | Admitting: Hematology & Oncology

## 2010-07-12 DIAGNOSIS — Z86718 Personal history of other venous thrombosis and embolism: Secondary | ICD-10-CM

## 2010-07-12 DIAGNOSIS — Z87891 Personal history of nicotine dependence: Secondary | ICD-10-CM

## 2010-07-12 DIAGNOSIS — M545 Low back pain: Secondary | ICD-10-CM

## 2010-07-12 LAB — CBC WITH DIFFERENTIAL (CANCER CENTER ONLY)
BASO%: 0.2 % (ref 0.0–2.0)
EOS%: 1.3 % (ref 0.0–7.0)
MCH: 29.1 pg (ref 26.0–34.0)
MCHC: 34.9 g/dL (ref 32.0–36.0)
MONO%: 7.1 % (ref 0.0–13.0)
NEUT#: 3.6 10*3/uL (ref 1.5–6.5)
Platelets: 232 10*3/uL (ref 145–400)
RDW: 12.5 % (ref 11.1–15.7)

## 2010-07-12 LAB — PROTIME-INR (CHCC SATELLITE)
INR: 2 (ref 2.0–3.5)
Protime: 24 Seconds — ABNORMAL HIGH (ref 10.6–13.4)

## 2010-07-13 NOTE — Procedures (Signed)
NAMEKENZEE, [name] NO.:  [id number]   MEDICAL RECORD NO.:  [id number]          PATIENT TYPE:  OUT   LOCATION:  SLEEP CENTER                 FACILITY:  [facility]   PHYSICIAN:  Clinton D. Maple Hudson, M.D. DATE OF BIRTH:  1955-11-22   DATE OF STUDY:  12/22/2003                              NOCTURNAL POLYSOMNOGRAM   INDICATIONS FOR STUDY:  Insomnia with sleep apnea. Epworth sleepiness score  8/24, BMI 36, weight 225 pounds.   SLEEP ARCHITECTURE:  The patient took four diazoxide, amitriptyline, Zoloft,  clonazepam, gabapentin, Seroquel before bed. Total sleep time 264 minutes  with sleep efficiency of 73 %. Stage I was 5%, stage II was 85%, stages II  and IV were absent,  and REM was 10% of total sleep time. Latency to sleep  onset 60 minutes. Latency to REM 153 minutes. Awake after sleep onset 37  minutes. Arousal index 28.8, which is increased.   RESPIRATORY DATA:  RDI 23.4 per hour indicating moderate obstructive sleep  apnea/hypopnea syndrome. This included two obstructive apneas and 101  hypopneas. Events were not positional. REM RDI was 48 per hour. Split study  could not be performed because the events developed too late in the night to  allow time for CPAP titration.   OXYGEN DATA:  Moderately loud snoring with oxygen desaturation to a nadir of  83% during apneas. Mean oxygen saturation through the study was 90% to 92%  on room air.   CARDIAC DATA:  Normal sinus rhythm.   MOVEMENT/PARASOMNIA:  Occasional leg jerk with little impact on sleep.   IMPRESSION/RECOMMENDATIONS:  Moderate obstructive sleep apnea/hypopnea  syndrome, RDI 23 per hour with desaturation to 83%. Consider return for CPAP  titration or evaluation for alternative therapy as appropriate.                                                           Clinton D. Maple Hudson, M.D.  Diplomate, [employer] Board   CDY/MEDQ  D:  12/25/2003 12:34:36  T:  12/25/2003 23:46:44  Job:  [id number]

## 2010-11-22 ENCOUNTER — Encounter (HOSPITAL_BASED_OUTPATIENT_CLINIC_OR_DEPARTMENT_OTHER): Payer: Self-pay | Admitting: Hematology & Oncology

## 2010-11-22 ENCOUNTER — Other Ambulatory Visit: Payer: Self-pay | Admitting: Hematology & Oncology

## 2010-11-22 DIAGNOSIS — Z7901 Long term (current) use of anticoagulants: Secondary | ICD-10-CM

## 2010-11-22 DIAGNOSIS — D6859 Other primary thrombophilia: Secondary | ICD-10-CM

## 2010-11-22 DIAGNOSIS — M545 Low back pain: Secondary | ICD-10-CM

## 2010-11-22 DIAGNOSIS — Z86718 Personal history of other venous thrombosis and embolism: Secondary | ICD-10-CM

## 2010-11-22 LAB — B-NATRIURETIC PEPTIDE (CONVERTED LAB): Pro B Natriuretic peptide (BNP): 96

## 2010-11-22 LAB — DIFFERENTIAL
Basophils Absolute: 0
Eosinophils Absolute: 0
Eosinophils Absolute: 0
Eosinophils Relative: 0
Eosinophils Relative: 0
Lymphocytes Relative: 8 — ABNORMAL LOW
Lymphs Abs: 0.4 — ABNORMAL LOW
Monocytes Absolute: 0.5

## 2010-11-22 LAB — LIPID PANEL
Cholesterol: 179
HDL: 65
LDL Cholesterol: 95
Total CHOL/HDL Ratio: 2.8
Triglycerides: 97

## 2010-11-22 LAB — POCT I-STAT, CHEM 8
BUN: 12
Calcium, Ion: 1.19
Chloride: 105
Creatinine, Ser: 0.6
Creatinine, Ser: 0.8
Glucose, Bld: 133 — ABNORMAL HIGH
Glucose, Bld: 143 — ABNORMAL HIGH
HCT: 34 — ABNORMAL LOW
Hemoglobin: 11.9 — ABNORMAL LOW
Potassium: 3.9

## 2010-11-22 LAB — PROTIME-INR
INR: 1.8 — ABNORMAL HIGH
INR: 1.9 — ABNORMAL HIGH
Prothrombin Time: 21.8 — ABNORMAL HIGH

## 2010-11-22 LAB — CBC WITH DIFFERENTIAL (CANCER CENTER ONLY)
BASO%: 0.2 % (ref 0.0–2.0)
EOS%: 1.6 % (ref 0.0–7.0)
Eosinophils Absolute: 0.1 10*3/uL (ref 0.0–0.5)
LYMPH%: 38.2 % (ref 14.0–48.0)
MCH: 30.5 pg (ref 26.0–34.0)
MCHC: 35.3 g/dL (ref 32.0–36.0)
MCV: 87 fL (ref 81–101)
MONO%: 8 % (ref 0.0–13.0)
Platelets: 241 10*3/uL (ref 145–400)
RDW: 12.5 % (ref 11.1–15.7)

## 2010-11-22 LAB — BLOOD GAS, ARTERIAL
Drawn by: 270091
FIO2: 0.21
pCO2 arterial: 39.1
pH, Arterial: 7.388

## 2010-11-22 LAB — URINALYSIS, ROUTINE W REFLEX MICROSCOPIC
Bilirubin Urine: NEGATIVE
Hgb urine dipstick: NEGATIVE
Ketones, ur: NEGATIVE
Nitrite: NEGATIVE
Protein, ur: NEGATIVE
Specific Gravity, Urine: 1.02
Urobilinogen, UA: 0.2

## 2010-11-22 LAB — PROTIME-INR (CHCC SATELLITE): Protime: 32.4 Seconds — ABNORMAL HIGH (ref 10.6–13.4)

## 2010-11-22 LAB — CBC
HCT: 32.5 — ABNORMAL LOW
HCT: 33.8 — ABNORMAL LOW
Hemoglobin: 11.1 — ABNORMAL LOW
MCHC: 34.5
MCV: 89.8
MCV: 90.7
MCV: 90.8
Platelets: 195
Platelets: 203
Platelets: 221
RBC: 3.49 — ABNORMAL LOW
RDW: 11.8
RDW: 12
WBC: 8.5
WBC: 8.7

## 2010-11-22 LAB — COMPREHENSIVE METABOLIC PANEL
ALT: 20
ALT: 22
AST: 25
Albumin: 3.2 — ABNORMAL LOW
Albumin: 3.4 — ABNORMAL LOW
Alkaline Phosphatase: 68
CO2: 26
Calcium: 8.4
Chloride: 104
Creatinine, Ser: 0.57
GFR calc Af Amer: >60
GFR calc non Af Amer: >60
Potassium: 3.8
Potassium: 4.1
Sodium: 136
Sodium: 137
Total Bilirubin: 0.5
Total Protein: 6.5

## 2010-11-22 LAB — RAPID URINE DRUG SCREEN, HOSP PERFORMED
Amphetamines: NOT DETECTED
Benzodiazepines: POSITIVE — AB
Cocaine: NOT DETECTED
Tetrahydrocannabinol: NOT DETECTED

## 2010-11-22 LAB — BASIC METABOLIC PANEL
Chloride: 104
GFR calc non Af Amer: >60
Glucose, Bld: 96
Potassium: 3.8
Sodium: 142

## 2010-11-22 LAB — RETICULOCYTES
RBC.: 3.68 — ABNORMAL LOW
Retic Count, Absolute: 40.5

## 2010-11-22 LAB — TSH: TSH: 0.226 — ABNORMAL LOW

## 2010-11-22 LAB — LIPASE, BLOOD: Lipase: 17

## 2010-11-22 LAB — IRON AND TIBC
Iron: 24 — ABNORMAL LOW
Saturation Ratios: 7 — ABNORMAL LOW
UIBC: 304

## 2010-11-22 LAB — URINE CULTURE

## 2010-11-22 LAB — HEMOGLOBIN A1C: Mean Plasma Glucose: 108

## 2010-11-22 LAB — APTT: aPTT: 40 — ABNORMAL HIGH

## 2010-11-22 LAB — FERRITIN: Ferritin: 94 (ref 10–291)

## 2010-11-22 LAB — VITAMIN B12: Vitamin B-12: 257 (ref 211–911)

## 2010-11-22 LAB — OCCULT BLOOD X 1 CARD TO LAB, STOOL: Fecal Occult Bld: NEGATIVE

## 2010-11-22 LAB — URINE MICROSCOPIC-ADD ON

## 2010-11-23 ENCOUNTER — Ambulatory Visit (HOSPITAL_BASED_OUTPATIENT_CLINIC_OR_DEPARTMENT_OTHER)
Admission: RE | Admit: 2010-11-23 | Discharge: 2010-11-23 | Disposition: A | Discharge status: Home / Self Care | Payer: Self-pay | Source: Ambulatory Visit | Attending: Hematology & Oncology | Admitting: Hematology & Oncology

## 2010-11-23 ENCOUNTER — Other Ambulatory Visit: Payer: Self-pay | Admitting: Hematology & Oncology

## 2010-11-23 DIAGNOSIS — I82409 Acute embolism and thrombosis of unspecified deep veins of unspecified lower extremity: Secondary | ICD-10-CM

## 2010-11-23 DIAGNOSIS — M25569 Pain in unspecified knee: Secondary | ICD-10-CM | POA: Insufficient documentation

## 2010-11-23 DIAGNOSIS — M7120 Synovial cyst of popliteal space [Baker], unspecified knee: Secondary | ICD-10-CM | POA: Insufficient documentation

## 2010-11-23 LAB — CREATININE, SERUM
Creatinine, Ser: 0.8
GFR calc Af Amer: >60
GFR calc non Af Amer: >60

## 2010-11-23 LAB — COMPREHENSIVE METABOLIC PANEL
AST: 22
AST: 24
CO2: 31
CO2: 32
Calcium: 8.8
Calcium: 9.6
Creatinine, Ser: 0.74
Creatinine, Ser: 0.75
GFR calc Af Amer: >60
GFR calc Af Amer: >60
GFR calc non Af Amer: >60
GFR calc non Af Amer: >60
Sodium: 139
Total Protein: 6

## 2010-11-23 LAB — LIPID PANEL
Cholesterol: 177
HDL: 32 — ABNORMAL LOW
LDL Cholesterol: 113 — ABNORMAL HIGH
Total CHOL/HDL Ratio: 5.5
Triglycerides: 158 — ABNORMAL HIGH

## 2010-11-23 LAB — HEPARIN LEVEL (UNFRACTIONATED)
Heparin Unfractionated: 0.5
Heparin Unfractionated: 0.62

## 2010-11-23 LAB — CBC
HCT: 35.4 — ABNORMAL LOW
MCHC: 33.7
MCHC: 34.1
MCHC: 34.2
MCV: 89
MCV: 89.1
Platelets: 244
RBC: 3.79 — ABNORMAL LOW
RBC: 4.13
RBC: 4.53
RDW: 12.3
RDW: 12.6
RDW: 12.7
WBC: 7.1

## 2010-11-23 LAB — TSH: TSH: 0.968

## 2010-11-23 LAB — DIFFERENTIAL
Basophils Absolute: 0
Basophils Relative: 0
Eosinophils Absolute: 0.1
Eosinophils Relative: 2
Lymphocytes Relative: 38
Lymphs Abs: 2.2
Neutrophils Relative %: 52

## 2010-11-23 LAB — HEPATIC FUNCTION PANEL
Albumin: 3.4 — ABNORMAL LOW
Total Bilirubin: 0.6

## 2010-11-23 LAB — IRON AND TIBC
Iron: 98
TIBC: 298
UIBC: 200

## 2010-11-23 LAB — TYPE AND SCREEN
ABO/RH(D): O NEG
Antibody Screen: NEGATIVE

## 2010-11-23 LAB — URINALYSIS, ROUTINE W REFLEX MICROSCOPIC
Bilirubin Urine: NEGATIVE
Glucose, UA: NEGATIVE
Hgb urine dipstick: NEGATIVE
Ketones, ur: NEGATIVE
Protein, ur: NEGATIVE
Urobilinogen, UA: 0.2

## 2010-11-23 LAB — BASIC METABOLIC PANEL
BUN: 3 — ABNORMAL LOW
Calcium: 8.6
Creatinine, Ser: 0.63
GFR calc non Af Amer: >60
Glucose, Bld: 103 — ABNORMAL HIGH

## 2010-11-23 LAB — PROTIME-INR
INR: 1.2
Prothrombin Time: 15

## 2010-11-23 LAB — FERRITIN: Ferritin: 107 (ref 10–291)

## 2010-11-23 LAB — ABO/RH: ABO/RH(D): O NEG

## 2010-11-23 LAB — FOLATE: Folate: 12.3

## 2010-11-23 LAB — OCCULT BLOOD X 1 CARD TO LAB, STOOL: Fecal Occult Bld: NEGATIVE

## 2010-11-23 LAB — POTASSIUM: Potassium: 3.4 — ABNORMAL LOW

## 2010-12-10 ENCOUNTER — Encounter (HOSPITAL_BASED_OUTPATIENT_CLINIC_OR_DEPARTMENT_OTHER): Payer: Self-pay | Admitting: Hematology & Oncology

## 2010-12-10 ENCOUNTER — Other Ambulatory Visit: Payer: Self-pay | Admitting: Hematology & Oncology

## 2010-12-10 DIAGNOSIS — M7120 Synovial cyst of popliteal space [Baker], unspecified knee: Secondary | ICD-10-CM

## 2010-12-10 DIAGNOSIS — Z7901 Long term (current) use of anticoagulants: Secondary | ICD-10-CM

## 2010-12-10 DIAGNOSIS — M545 Low back pain, unspecified: Secondary | ICD-10-CM

## 2010-12-10 DIAGNOSIS — D6859 Other primary thrombophilia: Secondary | ICD-10-CM

## 2010-12-10 DIAGNOSIS — Z86718 Personal history of other venous thrombosis and embolism: Secondary | ICD-10-CM

## 2010-12-10 LAB — PROTIME-INR (CHCC SATELLITE): Protime: 28.8 Seconds — ABNORMAL HIGH (ref 10.6–13.4)

## 2011-01-09 ENCOUNTER — Other Ambulatory Visit: Payer: Self-pay | Admitting: Lab

## 2011-01-29 ENCOUNTER — Telehealth: Payer: Self-pay | Admitting: *Deleted

## 2011-01-29 NOTE — Telephone Encounter (Signed)
Pt called stating that she would like to start taking hormonal therapy preferably OTC but knows it is controversial and can have some interactions with Coumadin. She stated "I need something because I am driving my family crazy". Will ask Harriett Sine if she has any suggestions.

## 2011-01-29 NOTE — Telephone Encounter (Signed)
Returned call to home number. No answer. Left VM to call back at her convenience.

## 2011-01-31 NOTE — Telephone Encounter (Signed)
I have never seen this patient. If she wants to make an appt and come in to talk about HRT, I could see her next week. I need to do a full assessment of her risks/benefits before making a recommendation.

## 2011-03-04 ENCOUNTER — Encounter: Payer: Self-pay | Admitting: Hematology & Oncology

## 2011-03-04 ENCOUNTER — Other Ambulatory Visit: Payer: Self-pay | Admitting: Hematology & Oncology

## 2011-03-04 ENCOUNTER — Other Ambulatory Visit (HOSPITAL_BASED_OUTPATIENT_CLINIC_OR_DEPARTMENT_OTHER): Payer: Self-pay | Admitting: Lab

## 2011-03-04 ENCOUNTER — Ambulatory Visit (HOSPITAL_BASED_OUTPATIENT_CLINIC_OR_DEPARTMENT_OTHER): Payer: Self-pay | Admitting: Hematology & Oncology

## 2011-03-04 VITALS — BP 111/80 | HR 88 | Temp 98.1°F | Ht 65.0 in | Wt 267.0 lb

## 2011-03-04 DIAGNOSIS — R110 Nausea: Secondary | ICD-10-CM

## 2011-03-04 DIAGNOSIS — I82409 Acute embolism and thrombosis of unspecified deep veins of unspecified lower extremity: Secondary | ICD-10-CM

## 2011-03-04 DIAGNOSIS — M4800 Spinal stenosis, site unspecified: Secondary | ICD-10-CM

## 2011-03-04 DIAGNOSIS — D6859 Other primary thrombophilia: Secondary | ICD-10-CM

## 2011-03-04 DIAGNOSIS — M48061 Spinal stenosis, lumbar region without neurogenic claudication: Secondary | ICD-10-CM

## 2011-03-04 DIAGNOSIS — M549 Dorsalgia, unspecified: Secondary | ICD-10-CM

## 2011-03-04 HISTORY — DX: Spinal stenosis, site unspecified: M48.00

## 2011-03-04 LAB — CBC WITH DIFFERENTIAL (CANCER CENTER ONLY)
BASO#: 0 10*3/uL (ref 0.0–0.2)
Eosinophils Absolute: 0.1 10*3/uL (ref 0.0–0.5)
HGB: 14.1 g/dL (ref 11.6–15.9)
LYMPH%: 34.4 % (ref 14.0–48.0)
MCH: 29.6 pg (ref 26.0–34.0)
MCV: 87 fL (ref 81–101)
MONO%: 9.2 % (ref 0.0–13.0)
RBC: 4.77 10*6/uL (ref 3.70–5.32)

## 2011-03-04 MED ORDER — PROMETHAZINE HCL 25 MG PO TABS: 25.0000 mg | ORAL_TABLET | Freq: Four times a day (QID) | ORAL | Status: DC | PRN

## 2011-03-04 MED ORDER — RIVAROXABAN 20 MG PO TABS: 20.0000 mg | ORAL_TABLET | Freq: Every day | ORAL | Status: DC

## 2011-03-04 MED ORDER — HYDROCODONE-ACETAMINOPHEN 10-325 MG PO TABS: 1.0000 | ORAL_TABLET | Freq: Four times a day (QID) | ORAL | Status: DC | PRN

## 2011-03-04 NOTE — Progress Notes (Signed)
DIAGNOSES: 1. Recurrent lower extremity DVT. 2. Prothrombin II gene mutation. 3. Severe lower back pain secondary to degenerative disk     disease/spinal stenosis.  CURRENT THERAPY:  Coumadin 7 5 mg p.o. daily.  INTERIM HISTORY:  Ms. Bullard comes in for followup.  As usual, her life is a "soap opera."  She has been down in [place].  Her niece down there has serious drug addiction problems.  Ms. Fehrman has been trying to help her out but to no avail.  She has not smoked now for a couple years.  She has done real well with this.  Her lower back is a real problem for her.  There is not much that can be done for this because of the extensive degeneration back there.  She has had no bleeding.  She has not had a blood clot in her legs now for over 11 years.  I really think that she could switch over to Xarelto.  This really would make life a lot easier for Ms. Gehl.  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  I really do not see a down side for Ms. Cadet.  She has had no change in bowel or bladder habits.  She has had no cough. She has had intermittent sinus issues.  PHYSICAL EXAM:  General:  This is an obese white female in no obvious distress.  Vital Signs:  Temperature 98.1, pulse 88, respiratory rate 16, blood pressure 111/80.  Weight is 267.  Head/Neck:  Exam shows a normocephalic, atraumatic skull.  There are no ocular or oral lesions. There are no palpable cervical or supraclavicular lymph nodes.  Lungs: Clear bilaterally.  Cardiac:  Regular rate and rhythm with a normal S1, S2.  There are no murmurs, rubs or bruits.  Abdomen:  Soft with good bowel sounds.  She is obese.  She has no fluid wave.  There is no guarding or rebound tenderness.  Her laparoscopy scars from her cholecystectomy are well healed.  Back:  No tenderness over the spine, ribs, or hips.  Extremities:  No clubbing, cyanosis or edema. Neurologic:  Exam shows no focal neurological  deficits.  LABORATORY STUDIES:  White cell count 6.5, hemoglobin 14.1, hematocrit 41.6, platelet count 280.  Her INR is 1.8.  IMPRESSION:  Ms. Mccrae is a 56 year old white female with prothrombin II gene mutation.  Again, she has not had DVT for about 11 years.  I really do not see a problem with her going on to Xarelto.  Her renal function is fine.  I think this would be a lot more convenient for her.  We will go ahead and plan to get Ms. Tews back to see [place] in another couple months or so.  I feel bad for Ms. Hirsch and all the family issues that she has to deal with.  She is doing a good job doing this.    ______________________________ Josph Macho, M.D. PRE/MEDQ  D:  03/04/2011  T:  03/04/2011  Job:  [id number]

## 2011-03-04 NOTE — Progress Notes (Signed)
This office note has been dictated.

## 2011-06-07 ENCOUNTER — Other Ambulatory Visit (HOSPITAL_BASED_OUTPATIENT_CLINIC_OR_DEPARTMENT_OTHER): Payer: Self-pay | Admitting: Lab

## 2011-06-07 ENCOUNTER — Ambulatory Visit (HOSPITAL_BASED_OUTPATIENT_CLINIC_OR_DEPARTMENT_OTHER): Payer: Self-pay | Admitting: Hematology & Oncology

## 2011-06-07 VITALS — BP 129/79 | HR 92 | Temp 99.4°F | Ht 65.0 in | Wt 265.0 lb

## 2011-06-07 DIAGNOSIS — D6859 Other primary thrombophilia: Secondary | ICD-10-CM

## 2011-06-07 DIAGNOSIS — Z86718 Personal history of other venous thrombosis and embolism: Secondary | ICD-10-CM

## 2011-06-07 DIAGNOSIS — Z7901 Long term (current) use of anticoagulants: Secondary | ICD-10-CM

## 2011-06-07 DIAGNOSIS — M48061 Spinal stenosis, lumbar region without neurogenic claudication: Secondary | ICD-10-CM

## 2011-06-07 DIAGNOSIS — I82409 Acute embolism and thrombosis of unspecified deep veins of unspecified lower extremity: Secondary | ICD-10-CM

## 2011-06-07 DIAGNOSIS — E039 Hypothyroidism, unspecified: Secondary | ICD-10-CM

## 2011-06-07 DIAGNOSIS — R110 Nausea: Secondary | ICD-10-CM

## 2011-06-07 LAB — CBC WITH DIFFERENTIAL (CANCER CENTER ONLY)
BASO#: 0 10*3/uL (ref 0.0–0.2)
BASO%: 0.2 % (ref 0.0–2.0)
EOS%: 1.9 % (ref 0.0–7.0)
Eosinophils Absolute: 0.1 10*3/uL (ref 0.0–0.5)
HCT: 36.6 % (ref 34.8–46.6)
HGB: 12.7 g/dL (ref 11.6–15.9)
LYMPH#: 2.5 10*3/uL (ref 0.9–3.3)
LYMPH%: 42.7 % (ref 14.0–48.0)
MCH: 30.2 pg (ref 26.0–34.0)
MCHC: 34.7 g/dL (ref 32.0–36.0)
MCV: 87 fL (ref 81–101)
MONO#: 0.5 10*3/uL (ref 0.1–0.9)
MONO%: 8.4 % (ref 0.0–13.0)
NEUT#: 2.7 10*3/uL (ref 1.5–6.5)
NEUT%: 46.8 % (ref 39.6–80.0)
Platelets: 272 10*3/uL (ref 145–400)
RBC: 4.21 10*6/uL (ref 3.70–5.32)
RDW: 12.2 % (ref 11.1–15.7)
WBC: 5.8 10*3/uL (ref 3.9–10.0)

## 2011-06-07 LAB — PROTIME-INR (CHCC SATELLITE)

## 2011-06-07 MED ORDER — CYCLOBENZAPRINE HCL 10 MG PO TABS: 10.0000 mg | ORAL_TABLET | Freq: Three times a day (TID) | ORAL | Status: AC | PRN

## 2011-06-07 MED ORDER — PROMETHAZINE HCL 25 MG PO TABS: 25.0000 mg | ORAL_TABLET | Freq: Four times a day (QID) | ORAL | Status: AC | PRN

## 2011-06-07 MED ORDER — WARFARIN SODIUM 7.5 MG PO TABS: 7.5000 mg | ORAL_TABLET | Freq: Every day | ORAL | Status: DC

## 2011-06-07 MED ORDER — HYDROCODONE-ACETAMINOPHEN 10-325 MG PO TABS: 1.0000 | ORAL_TABLET | Freq: Four times a day (QID) | ORAL | Status: DC | PRN

## 2011-06-07 NOTE — Progress Notes (Signed)
This office note has been dictated.

## 2011-06-08 NOTE — Progress Notes (Signed)
DIAGNOSES: 1. Prothrombin II gene mutation. 2. History of recurrent lower extremity DVT (none for the past 11-12     years). 3. Chronic low back pain secondary to spinal stenosis and degenerative     disk disease.  CURRENT THERAPY:  Coumadin 7.5 mg p.o. daily.  INTERVAL HISTORY:  [name] comes in for followup.  She actually is doing okay.  Her life is beginning to settle down will bit.  We did try to get her on Xarelto.  Unfortunately, she cannot afford this as she has no insurance.  As such, she is back on Coumadin.  She has not smoked now for a couple years.  She has done real well with this.  Her weight is not going up, which is also a blessing.  She does need to lose a little weight, which I think will probably help her back.  She has had no bleeding.  She has had no problems with asthma flare ups. She still has the chronic back issues.  She takes Norco for this.  She has not noticed any leg swelling.  There have  been no rashes.  PHYSICAL EXAMINATION:  This is a mildly obese white female in no obvious distress.  Vital signs:  99.4, pulse 92, respiratory rate 18, blood pressure 129/79.  Weight is 265.  Head and neck exam shows a normocephalic, atraumatic skull.  There are no ocular or oral lesions. There are no palpable cervical or supraclavicular lymph nodes.  Lungs: Clear bilaterally.  Cardiac:  Regular rhythm with a normal S1, S2. There are no murmurs, rubs or bruits.  Abdomen:  Soft, obese.  She has good bowel sounds.  There is no fluid wave.  There is no palpable hepatosplenomegaly.  She has well-healed laparoscopy scars from her cholecystectomy.  Back: No tenderness of the spine, ribs, or hips. There may be some slight spasms in the lumbosacral spine.  Extremities: No palpable venous cord.  She has good range of motion of her joints. Skin:  Slightly dry.  LABORATORY STUDIES:  White cell count 5.8, hemoglobin 12.7, hematocrit 36.6, platelet count 272.  INR is  2.1.  IMPRESSION:  [name] is a 56 year old white female with a remote history of DVT of her legs.  She does have the prothrombin II gene mutation.  She is on lifelong Coumadin.  Since she is doing okay on Coumadin, we will not bother trying to move her over to Xarelto.  I did refill her Norco prescription today.  I also refilled her Flexeril.  I think we can probably get her back in another 2-3 months.  I do not believe she needs any blood work in between visits.    ______________________________ Josph Macho, M.D. PRE/MEDQ  D:  06/07/2011  T:  06/08/2011  Job:  [phone]

## 2011-06-10 ENCOUNTER — Telehealth: Payer: Self-pay | Admitting: Hematology & Oncology

## 2011-06-10 LAB — COMPREHENSIVE METABOLIC PANEL
Albumin: 4.2 g/dL (ref 3.5–5.2)
Alkaline Phosphatase: 80 U/L (ref 39–117)
BUN: 10 mg/dL (ref 6–23)
CO2: 23 mEq/L (ref 19–32)
Glucose, Bld: 90 mg/dL (ref 70–99)
Potassium: 4.1 mEq/L (ref 3.5–5.3)

## 2011-06-10 NOTE — Telephone Encounter (Signed)
Mailed 7-15 schedule 

## 2011-06-21 ENCOUNTER — Telehealth: Payer: Self-pay | Admitting: *Deleted

## 2011-06-21 NOTE — Telephone Encounter (Addendum)
Message copied by Mirian Capuchin on Fri Jun 21, 2011 11:45 AM ------      Message from: Arlan Organ R      Created: Thu Jun 13, 2011  6:15 PM       Call - thyroid is ok  Called & spoke to patient reg. TSH results. ST

## 2011-07-29 ENCOUNTER — Emergency Department (HOSPITAL_COMMUNITY)
Admission: EM | Admit: 2011-07-29 | Discharge: 2011-07-30 | Disposition: A | Discharge status: Home / Self Care | Payer: Self-pay | Attending: Emergency Medicine | Admitting: Emergency Medicine

## 2011-07-29 ENCOUNTER — Encounter (HOSPITAL_COMMUNITY): Payer: Self-pay | Admitting: *Deleted

## 2011-07-29 DIAGNOSIS — M542 Cervicalgia: Secondary | ICD-10-CM | POA: Insufficient documentation

## 2011-07-29 DIAGNOSIS — M4800 Spinal stenosis, site unspecified: Secondary | ICD-10-CM | POA: Insufficient documentation

## 2011-07-29 HISTORY — DX: Acute embolism and thrombosis of unspecified deep veins of unspecified lower extremity: I82.409

## 2011-07-29 LAB — PROTIME-INR: Prothrombin Time: 23.9 seconds — ABNORMAL HIGH (ref 11.6–15.2)

## 2011-07-29 MED ORDER — HYDROMORPHONE HCL PF 1 MG/ML IJ SOLN
1.0000 mg | Freq: Once | INTRAMUSCULAR | Status: AC
  Administered 2011-07-29: 1 mg via INTRAVENOUS
  Filled 2011-07-29: qty 1

## 2011-07-29 MED ORDER — OXYCODONE-ACETAMINOPHEN 5-325 MG PO TABS: 1.0000 | ORAL_TABLET | Freq: Once | ORAL | Status: DC

## 2011-07-29 MED ORDER — KETOROLAC TROMETHAMINE 30 MG/ML IJ SOLN
30.0000 mg | Freq: Once | INTRAMUSCULAR | Status: AC
  Administered 2011-07-29: 30 mg via INTRAVENOUS
  Filled 2011-07-29: qty 1

## 2011-07-29 MED ORDER — DIAZEPAM 5 MG PO TABS
5.0000 mg | ORAL_TABLET | Freq: Once | ORAL | Status: AC
  Administered 2011-07-29: 5 mg via ORAL
  Filled 2011-07-29: qty 1

## 2011-07-29 MED ORDER — KETOROLAC TROMETHAMINE 60 MG/2ML IM SOLN: 60.0000 mg | Freq: Once | INTRAMUSCULAR | Status: DC

## 2011-07-29 NOTE — ED Notes (Signed)
Pt developed neck pain this morning; feels like a strained muscle from shoulder up to upper neck right side

## 2011-07-29 NOTE — ED Provider Notes (Signed)
History     CSN: [id number]  Arrival date & time 07/29/11  2129   First MD Initiated Contact with Patient 07/29/11 2238      Chief Complaint  Patient presents with  . Neck Pain    (Consider location/radiation/quality/duration/timing/severity/associated sxs/prior treatment) The history is provided by the patient.   patient reports neck pain that began this morning and continues to progress through the day.  She denies weakness ever upper lower extremities.  She's had no fevers or chills.  She is on Coumadin for history of DVT.  Her last INR one month ago was 2.  She denies numbness.  She denies difficulty breathing or swallowing.  She's had no new rash.  Her pain is worsened by palpation and movement of her neck.  Nothing improves her pain.  She's not tried any of her pain medications at home.  She reports she did not try any of her medications because she is concerned about infecting her INR  Past Medical History  Diagnosis Date  . Spinal stenosis 03/04/2011  . DVT (deep venous thrombosis)     Past Surgical History  Procedure Date  . Cholecystectomy   . Hemorroidectomy     No family history on file.  History  Substance Use Topics  . Smoking status: Former Games developer  . Smokeless tobacco: Not on file  . Alcohol Use: Yes     occasionally    OB History    Grav Para Term Preterm Abortions TAB SAB Ect Mult Living                  Review of Systems  All other systems reviewed and are negative.    Allergies  Enoxaparin sodium; Other; Penicillins; and Sulfonamide derivatives  Home Medications   Current Outpatient Rx  Name Route Sig Dispense Refill  . IPRATROPIUM-ALBUTEROL 18-103 MCG/ACT IN AERO Inhalation Inhale 2 puffs into the lungs every 4 (four) hours as needed. For shortness of breath    . CHLORDIAZEPOXIDE-AMITRIPTYLINE 5-12.5 MG PO TABS Oral Take 1 tablet by mouth daily.      . BUDESONIDE-FORMOTEROL FUMARATE 160-4.5 MCG/ACT IN AERO Inhalation Inhale 2 puffs into the  lungs 2 (two) times daily.      Marland Kitchen CLONAZEPAM 1 MG PO TABS Oral Take 1 mg by mouth Nightly.      Marland Kitchen GABAPENTIN 800 MG PO TABS Oral Take 1,600 mg by mouth Nightly.      Marland Kitchen HYDROCODONE-ACETAMINOPHEN 10-325 MG PO TABS Oral Take 1 tablet by mouth every 6 (six) hours as needed. 90 tablet 0  . PROMETHAZINE HCL 25 MG PO TABS Oral Take 1 tablet (25 mg total) by mouth every 6 (six) hours as needed. 90 tablet 6  . SERTRALINE HCL 100 MG PO TABS Oral Take 200 mg by mouth daily.      . WARFARIN SODIUM 7.5 MG PO TABS Oral Take 1 tablet (7.5 mg total) by mouth daily. 30 tablet 12    BP 158/88  Pulse 107  Temp(Src) 98 F (36.7 C) (Oral)  Resp 18  Ht 5\' 6"  (1.676 m)  Wt 260 lb (117.935 kg)  BMI 41.97 kg/m2  SpO2 96%  Physical Exam  Nursing note and vitals reviewed. Constitutional: She is oriented to person, place, and time. She appears well-developed and well-nourished. No distress.  HENT:  Head: Normocephalic and atraumatic.  Eyes: EOM are normal.  Neck: Neck supple.       Anterior neck is normal.  No lymphadenopathy.  No C-spine tenderness.  No erythema, abscess, or  fluctuance noted.  The patient does have mild tenderness of her right her cervical muscles.  No carotid bruits  Cardiovascular: Normal rate, regular rhythm and normal heart sounds.   Pulmonary/Chest: Effort normal and breath sounds normal.  Abdominal: Soft. She exhibits no distension. There is no tenderness.  Musculoskeletal: Normal range of motion.       5 out of 5 strength in bilateral lower and upper extremity major muscle groups  Neurological: She is alert and oriented to person, place, and time.  Skin: Skin is warm and dry.  Psychiatric: She has a normal mood and affect. Judgment normal.    ED Course  Procedures (including critical care time)  Labs Reviewed  PROTIME-INR - Abnormal; Notable for the following:    Prothrombin Time 23.9 (*)    INR 2.10 (*)    All other components within normal limits   No results  found.   1. Neck pain       MDM  Cervical and paracervical discomfort.  No indication for imaging.  She has normal strength in her upper lower extremity was.  She is feeling better in the emergency apartment with pain medications.  Her INR is 2.1.  I doubt epidural hematoma.  There is no acute indication for imaging.  Close PCP followup        Lyanne Co, MD 07/30/11 [phone]

## 2011-07-30 MED ORDER — ONDANSETRON HCL 4 MG/2ML IJ SOLN
4.0000 mg | Freq: Once | INTRAMUSCULAR | Status: AC
  Administered 2011-07-30: 4 mg via INTRAVENOUS
  Filled 2011-07-30: qty 2

## 2011-07-30 MED ORDER — OXYCODONE-ACETAMINOPHEN 5-325 MG PO TABS: 1.0000 | ORAL_TABLET | ORAL | Status: AC | PRN

## 2011-07-30 MED ORDER — HYDROMORPHONE HCL PF 1 MG/ML IJ SOLN
1.0000 mg | Freq: Once | INTRAMUSCULAR | Status: AC
  Administered 2011-07-30: 1 mg via INTRAVENOUS
  Filled 2011-07-30: qty 1

## 2011-07-30 NOTE — Discharge Instructions (Signed)
Pain of Unknown Etiology (Pain Without a Known Cause) You have come to your caregiver because of pain. Pain can occur in any part of the body. Often there is not a definite cause. If your laboratory (blood or urine) work was normal and x-rays or other studies were normal, your caregiver may treat you without knowing the cause of the pain. An example of this is the headache. Most headaches are diagnosed by taking a history. This means your caregiver asks you questions about your headaches. Your caregiver determines a treatment based on your answers. Usually testing done for headaches is normal. Often testing is not done unless there is no response to medications. Regardless of where your pain is located today, you can be given medications to make you comfortable. If no physical cause of pain can be found, most cases of pain will gradually leave as suddenly as they came.  If you have a painful condition and no reason can be found for the pain, It is importantthat you follow up with your caregiver. If the pain becomes worse or does not go away, it may be necessary to repeat tests and look further for a possible cause.  Only take over-the-counter or prescription medicines for pain, discomfort, or fever as directed by your caregiver.   For the protection of your privacy, test results can not be given over the phone. Make sure you receive the results of your test. Ask as to how these results are to be obtained if you have not been informed. It is your responsibility to obtain your test results.   You may continue all activities unless the activities cause more pain. When the pain lessens, it is important to gradually resume normal activities. Resume activities by beginning slowly and gradually increasing the intensity and duration of the activities or exercise. During periods of severe pain, bed-rest may be helpful. Lay or sit in any position that is comfortable.   Ice used for acute (sudden) conditions may be  effective. Use a large plastic bag filled with ice and wrapped in a towel. This may provide pain relief.   See your caregiver for continued problems. They can help or refer you for exercises or physical therapy if necessary.  If you were given medications for your condition, do not drive, operate machinery or power tools, or sign legal documents for 24 hours. Do not drink alcohol, take sleeping pills, or take other medications that may interfere with treatment. See your caregiver immediately if you have pain that is becoming worse and not relieved by medications. Document Released: 11/06/2000 Document Revised: 01/31/2011 Document Reviewed: 02/11/2005 Exit[employer] Patient Information 2012 [employer]. 

## 2011-09-09 ENCOUNTER — Other Ambulatory Visit (HOSPITAL_BASED_OUTPATIENT_CLINIC_OR_DEPARTMENT_OTHER): Payer: Self-pay | Admitting: Lab

## 2011-09-09 ENCOUNTER — Ambulatory Visit (HOSPITAL_BASED_OUTPATIENT_CLINIC_OR_DEPARTMENT_OTHER): Payer: Self-pay | Admitting: Hematology & Oncology

## 2011-09-09 VITALS — BP 138/72 | HR 74 | Temp 97.7°F | Ht 66.0 in | Wt 260.0 lb

## 2011-09-09 DIAGNOSIS — Z86718 Personal history of other venous thrombosis and embolism: Secondary | ICD-10-CM

## 2011-09-09 DIAGNOSIS — D6859 Other primary thrombophilia: Secondary | ICD-10-CM

## 2011-09-09 DIAGNOSIS — I82409 Acute embolism and thrombosis of unspecified deep veins of unspecified lower extremity: Secondary | ICD-10-CM

## 2011-09-09 DIAGNOSIS — Z7901 Long term (current) use of anticoagulants: Secondary | ICD-10-CM

## 2011-09-09 DIAGNOSIS — M48061 Spinal stenosis, lumbar region without neurogenic claudication: Secondary | ICD-10-CM

## 2011-09-09 LAB — CBC WITH DIFFERENTIAL (CANCER CENTER ONLY)
BASO#: 0 10*3/uL (ref 0.0–0.2)
Eosinophils Absolute: 0.1 10*3/uL (ref 0.0–0.5)
HGB: 13.5 g/dL (ref 11.6–15.9)
LYMPH#: 2.2 10*3/uL (ref 0.9–3.3)
MCH: 29.7 pg (ref 26.0–34.0)
MONO#: 0.4 10*3/uL (ref 0.1–0.9)
NEUT#: 3.4 10*3/uL (ref 1.5–6.5)
Platelets: 231 10*3/uL (ref 145–400)
RBC: 4.54 10*6/uL (ref 3.70–5.32)
WBC: 6.1 10*3/uL (ref 3.9–10.0)

## 2011-09-09 LAB — PROTIME-INR (CHCC SATELLITE)
INR: 3.2 (ref 2.0–3.5)
Protime: 38.4 Seconds — ABNORMAL HIGH (ref 10.6–13.4)

## 2011-09-09 MED ORDER — HYDROCODONE-ACETAMINOPHEN 10-325 MG PO TABS: 1.0000 | ORAL_TABLET | Freq: Four times a day (QID) | ORAL | Status: DC | PRN

## 2011-09-09 NOTE — Progress Notes (Signed)
This office note has been dictated.

## 2011-09-10 NOTE — Progress Notes (Signed)
DIAGNOSIS: 1. Prothrombin II gene mutation. 2. History of recurrent lower extremity DVT (no recurrence for 12     years). 3. Spinal stenosis and degenerative lumbar disk disease.  CURRENT THERAPY:  Coumadin 7.5 mg p.o. daily.  INTERIM HISTORY:  [name] comes in for followup.  She was recently in the emergency room.  She thought that she had a blood clot in her neck. I reassured her that this could not happen physically.  She appeared to just have strained a muscle in her neck.  She is still under a lot of stress.  She is doing well on the Coumadin.  She has had no bleeding on the Coumadin.  There has been no change in bowel or bladder habits.  There has been no leg swelling.  She still has problems with her lower back.  She is not an operative candidate.  She is on Norco.  This does seem to help keep her functional.  PHYSICAL EXAMINATION:  This is a moderately obese, white female in no obvious distress.  Vital signs:  Temperature 97.7, pulse 74, respiratory rate 20, blood pressure 138/72, weight is 260.  Head and neck: Normocephalic, atraumatic skull.  There are no ocular or oral lesions. There are no palpable cervical or supraclavicular lymph nodes.  Lungs: Clear to percussion and auscultation bilaterally.  Cardiac:  Regular rate and rhythm with a normal S1 and S2.  There are no murmurs, rubs or bruits.  Abdomen:  Soft with good bowel sounds.  There is no palpable abdominal mass.  There is no palpable hepatosplenomegaly.  Back:  No tenderness over the spine, ribs, or hips.  Extremities show no clubbing, cyanosis or edema.  Neurological:  Shows no focal neurological deficits.  LABORATORY STUDIES:  White cell count 6.1, hemoglobin 13.5, hematocrit 38.8, platelet count 231.  INR is 3.2.  IMPRESSION:  [name] is a 56 year old white female with prothrombin II gene mutation.  Again, she is on lifelong Coumadin.  She has not had any problems with DVT recurrence for about 12  years.  We will plan to see her back in about 3 months' time.  We did go ahead and refill her Norco.  She unfortunately, just cannot see any other doctors.  She has no insurance.  We try to help manage as much as we can for her to make life easier for her.   ______________________________ Josph Macho, M.D. PRE/MEDQ  D:  09/09/2011  T:  09/10/2011  Job:  [id number]

## 2011-10-15 ENCOUNTER — Other Ambulatory Visit: Payer: Self-pay | Admitting: Family Medicine

## 2011-11-19 ENCOUNTER — Other Ambulatory Visit (HOSPITAL_BASED_OUTPATIENT_CLINIC_OR_DEPARTMENT_OTHER): Payer: Self-pay | Admitting: Lab

## 2011-11-19 ENCOUNTER — Ambulatory Visit (HOSPITAL_BASED_OUTPATIENT_CLINIC_OR_DEPARTMENT_OTHER): Payer: Self-pay | Admitting: Hematology & Oncology

## 2011-11-19 ENCOUNTER — Telehealth: Payer: Self-pay | Admitting: Hematology & Oncology

## 2011-11-19 VITALS — BP 110/74 | HR 100 | Temp 98.1°F | Resp 20 | Ht 66.0 in | Wt 263.0 lb

## 2011-11-19 DIAGNOSIS — M48061 Spinal stenosis, lumbar region without neurogenic claudication: Secondary | ICD-10-CM

## 2011-11-19 DIAGNOSIS — Z7901 Long term (current) use of anticoagulants: Secondary | ICD-10-CM

## 2011-11-19 DIAGNOSIS — Z86718 Personal history of other venous thrombosis and embolism: Secondary | ICD-10-CM

## 2011-11-19 DIAGNOSIS — I82409 Acute embolism and thrombosis of unspecified deep veins of unspecified lower extremity: Secondary | ICD-10-CM

## 2011-11-19 DIAGNOSIS — D6859 Other primary thrombophilia: Secondary | ICD-10-CM

## 2011-11-19 LAB — CBC WITH DIFFERENTIAL (CANCER CENTER ONLY)
BASO#: 0 10*3/uL (ref 0.0–0.2)
Eosinophils Absolute: 0.1 10*3/uL (ref 0.0–0.5)
HGB: 14.2 g/dL (ref 11.6–15.9)
LYMPH#: 1.6 10*3/uL (ref 0.9–3.3)
MCH: 30.1 pg (ref 26.0–34.0)
MONO%: 8.6 % (ref 0.0–13.0)
NEUT#: 3.7 10*3/uL (ref 1.5–6.5)
Platelets: 233 10*3/uL (ref 145–400)
RBC: 4.72 10*6/uL (ref 3.70–5.32)
WBC: 5.8 10*3/uL (ref 3.9–10.0)

## 2011-11-19 LAB — PROTIME-INR (CHCC SATELLITE)
INR: 1.6 — ABNORMAL LOW (ref 2.0–3.5)
Protime: 19.2 Seconds — ABNORMAL HIGH (ref 10.6–13.4)

## 2011-11-19 MED ORDER — HYDROCODONE-ACETAMINOPHEN 10-325 MG PO TABS: 1.0000 | ORAL_TABLET | Freq: Four times a day (QID) | ORAL | Status: DC | PRN

## 2011-11-19 NOTE — Progress Notes (Signed)
This office note has been dictated.

## 2011-11-19 NOTE — Progress Notes (Signed)
CC:   [name]. Little, M.D.  DIAGNOSES: 1. Prothrombin II gene mutation. 2. Recurrent deep venous thrombosis of the legs. 3. Severe lumbar spinal stenosis.  CURRENT THERAPY:  Coumadin 7.5 mg p.o. daily.  INTERIM HISTORY:  [name] comes in for followup.  There are still a lot of issues with her family.  She does her best to try to sort out all these issues.  She has had no problem with bleeding or bruising.  She does have problems with allergies.  This is seasonal more than anything else.  She has had no abdominal pain.  She is not smoking.  She has had no leg swelling.  There has been no leg pain.  PHYSICAL EXAMINATION:  General:  This is an obese white female in no obvious distress.  Vital Signs:  Show a temperature of 98.1, pulse 100, respiratory rate 18, blood pressure 110/74, weight is 263.  Head and Neck Exam:  Shows a normocephalic, atraumatic skull.  There are no ocular or oral lesions.  There are no palpable cervical or supraclavicular lymph nodes.  Lungs:  Clear to percussion and auscultation bilaterally.  Cardiac Exam:  Regular rate and rhythm with a normal S1 and S2.  There are no murmurs, rubs, or bruits.  Abdominal Exam:  Obese, but soft.  She has good bowel sounds.  There is no fluid wave.  There is no palpable hepatosplenomegaly.  Back Exam:  No tenderness over the spine, ribs, or hips.  Extremities:  Show no clubbing, cyanosis, or edema.  Skin Exam:  No rashes, ecchymoses, or petechia.  LABORATORY STUDIES:  White cell count is 5.8, hemoglobin 14.2, hematocrit 41.1, platelet count 233.  INR is 1.6 (the patient has missed a couple of doses over the past weekend.)  IMPRESSION:  [name] is a 56 year old white female with prothrombin II gene mutation.  Thankfully, she has not had a deep venous thrombosis now for about 12 years.  She remains on Coumadin.  She is doing good with Coumadin.  We basically check this whenever we see her.  We will go ahead and plan  to get her back to see [place] in another 2 to 3 months.  Again, she will be going down to [place] to deal with family problems.   ______________________________ Josph Macho, M.D. PRE/MEDQ  D:  11/19/2011  T:  11/19/2011  Job:  [id number]

## 2011-11-19 NOTE — Telephone Encounter (Signed)
Follow up appointment was sch and mailed out to patient's home

## 2011-12-05 ENCOUNTER — Other Ambulatory Visit: Payer: Self-pay | Admitting: Lab

## 2011-12-05 ENCOUNTER — Ambulatory Visit: Payer: Self-pay | Admitting: Hematology & Oncology

## 2012-01-15 ENCOUNTER — Telehealth: Payer: Self-pay | Admitting: Hematology & Oncology

## 2012-01-15 NOTE — Telephone Encounter (Signed)
Patient walked in to office and cx 02/18/12 apt and resch for 01/22/12

## 2012-01-22 ENCOUNTER — Ambulatory Visit (HOSPITAL_BASED_OUTPATIENT_CLINIC_OR_DEPARTMENT_OTHER): Payer: Self-pay | Admitting: Hematology & Oncology

## 2012-01-22 ENCOUNTER — Other Ambulatory Visit (HOSPITAL_BASED_OUTPATIENT_CLINIC_OR_DEPARTMENT_OTHER): Payer: Self-pay

## 2012-01-22 ENCOUNTER — Other Ambulatory Visit: Payer: Self-pay | Admitting: Lab

## 2012-01-22 VITALS — BP 126/79 | HR 78 | Temp 98.1°F | Resp 18 | Ht 66.0 in | Wt 260.0 lb

## 2012-01-22 DIAGNOSIS — M48061 Spinal stenosis, lumbar region without neurogenic claudication: Secondary | ICD-10-CM

## 2012-01-22 DIAGNOSIS — I82409 Acute embolism and thrombosis of unspecified deep veins of unspecified lower extremity: Secondary | ICD-10-CM

## 2012-01-22 DIAGNOSIS — Z7901 Long term (current) use of anticoagulants: Secondary | ICD-10-CM

## 2012-01-22 LAB — CBC WITH DIFFERENTIAL (CANCER CENTER ONLY)
EOS%: 1.3 % (ref 0.0–7.0)
Eosinophils Absolute: 0.1 10*3/uL (ref 0.0–0.5)
LYMPH#: 1.8 10*3/uL (ref 0.9–3.3)
MCH: 29.9 pg (ref 26.0–34.0)
MCHC: 34.4 g/dL (ref 32.0–36.0)
MONO%: 8.5 % (ref 0.0–13.0)
NEUT#: 2.8 10*3/uL (ref 1.5–6.5)
Platelets: 248 10*3/uL (ref 145–400)
RBC: 4.48 10*6/uL (ref 3.70–5.32)

## 2012-01-22 LAB — PROTIME-INR (CHCC SATELLITE)
INR: 1.7 — ABNORMAL LOW (ref 2.0–3.5)
Protime: 20.4 Seconds — ABNORMAL HIGH (ref 10.6–13.4)

## 2012-01-22 MED ORDER — WARFARIN SODIUM 7.5 MG PO TABS: ORAL_TABLET | ORAL | Status: AC

## 2012-01-22 MED ORDER — HYDROCODONE-ACETAMINOPHEN 10-325 MG PO TABS: 1.0000 | ORAL_TABLET | Freq: Four times a day (QID) | ORAL | Status: DC | PRN

## 2012-01-22 NOTE — Progress Notes (Signed)
This office note has been dictated.

## 2012-01-23 NOTE — Progress Notes (Signed)
CC:   Anna Genre. Little, M.D.  DIAGNOSES: 1. Prothrombin II gene mutation. 2. History of recurrent deep vein thromboses of the lower extremities. 3. Lumbar spinal stenosis-severe/inoperable.  CURRENT THERAPY:  Coumadin to maintain INR between 2-3.  Her current dose is 7.5 mg a day.  INTERIM HISTORY:  [name] comes in for her followup.  She is doing okay physically.  She is still not smoking.  She is trying to exercise a little bit more.  She is not really going anywhere for the holidays as far she knows. Some of her family, I think, are coming over.  The patient does have some occasional leg pains.  There has been no leg swelling.  She is going to have a gastric bypass.  She is not sure when she is going to have this.  I told her that when she does have it, that she needs to make sure that we have enough advance notice so we can "bridge" her with Arixtra.  She is allergic to Lovenox.  She has had no bleeding.  There has been no change in bowel or bladder habits.  PHYSICAL EXAMINATION:  General:  This is a well-developed, well- nourished white female in no obvious distress.  Vital signs: 98.1, pulse 78, respiratory rate 18, blood pressure 126/79.  Weight is 260.  Head and neck:  Normocephalic, atraumatic skull.  There are no ocular or oral lesions.  There are no palpable cervical or supraclavicular lymph nodes. Lungs:  Clear bilaterally.  Cardiac:  Regular rate and rhythm with a normal S1 and S2.  There are no murmurs, rubs, or bruits.  Abdomen: Soft with good bowel sounds.  There is no palpable abdominal mass. There is no fluid wave.  There is no palpable hepatosplenomegaly.  She has well-healed laparoscopy scar from her cholecystectomy.  Extremities: No clubbing, cyanosis, or edema.  She has no venous cord in her legs. She has good range of motion of her joints.  She has decent pulses in her distal extremities.  Skin:  Somewhat dry.  She may have some early keratoses on her  forehead.  LABORATORY STUDIES:  Show an INR of 1.7.  IMPRESSION:  [name] is a 56 year old white female on lifelong Coumadin.  We will increase her Coumadin dose up to 11 mg a day.  She has 7.5-mg pills at home.  I just told her to take 1-1/2 a day.  We will recheck her INR in about a week.  Again, if she is going to have gastric bypass, we will definitely need know so that we can prepare her appropriately.  I will plan to see her back myself in another couple of  months.    ______________________________ Josph Macho, M.D. PRE/MEDQ  D:  01/22/2012  T:  01/23/2012  Job:  [id number]

## 2012-01-29 ENCOUNTER — Other Ambulatory Visit: Payer: Self-pay | Admitting: Lab

## 2012-01-29 ENCOUNTER — Telehealth: Payer: Self-pay | Admitting: Hematology & Oncology

## 2012-01-29 NOTE — Telephone Encounter (Signed)
Patient called and cx lab apt for today due to being sick.  She stated she will call back to resch

## 2012-02-18 ENCOUNTER — Ambulatory Visit: Payer: Self-pay | Admitting: Hematology & Oncology

## 2012-02-18 ENCOUNTER — Other Ambulatory Visit: Payer: Self-pay | Admitting: Lab

## 2012-03-25 ENCOUNTER — Ambulatory Visit: Payer: Self-pay | Admitting: Hematology & Oncology

## 2012-03-25 ENCOUNTER — Other Ambulatory Visit: Payer: Self-pay | Admitting: Lab

## 2012-03-26 ENCOUNTER — Telehealth: Payer: Self-pay | Admitting: Hematology & Oncology

## 2012-03-26 NOTE — Telephone Encounter (Signed)
Pt made 2-7 appointment for 1-29 missed appointment

## 2012-04-03 ENCOUNTER — Ambulatory Visit (HOSPITAL_BASED_OUTPATIENT_CLINIC_OR_DEPARTMENT_OTHER): Payer: Self-pay | Admitting: Hematology & Oncology

## 2012-04-03 ENCOUNTER — Other Ambulatory Visit (HOSPITAL_BASED_OUTPATIENT_CLINIC_OR_DEPARTMENT_OTHER): Payer: Self-pay | Admitting: Lab

## 2012-04-03 VITALS — BP 109/69 | HR 83 | Temp 98.0°F | Resp 18 | Ht 66.0 in | Wt 258.0 lb

## 2012-04-03 DIAGNOSIS — Z7901 Long term (current) use of anticoagulants: Secondary | ICD-10-CM

## 2012-04-03 DIAGNOSIS — I82409 Acute embolism and thrombosis of unspecified deep veins of unspecified lower extremity: Secondary | ICD-10-CM

## 2012-04-03 DIAGNOSIS — M48061 Spinal stenosis, lumbar region without neurogenic claudication: Secondary | ICD-10-CM

## 2012-04-03 DIAGNOSIS — D6859 Other primary thrombophilia: Secondary | ICD-10-CM

## 2012-04-03 LAB — CBC WITH DIFFERENTIAL (CANCER CENTER ONLY)
BASO#: 0 10*3/uL (ref 0.0–0.2)
EOS%: 1.5 % (ref 0.0–7.0)
HCT: 40.3 % (ref 34.8–46.6)
HGB: 13.6 g/dL (ref 11.6–15.9)
LYMPH%: 30.3 % (ref 14.0–48.0)
MCH: 30 pg (ref 26.0–34.0)
MCHC: 33.7 g/dL (ref 32.0–36.0)
MONO%: 9.3 % (ref 0.0–13.0)
NEUT%: 58.7 % (ref 39.6–80.0)

## 2012-04-03 LAB — BASIC METABOLIC PANEL
BUN: 13 mg/dL (ref 6–23)
Calcium: 9.6 mg/dL (ref 8.4–10.5)
Creatinine, Ser: 0.75 mg/dL (ref 0.50–1.10)

## 2012-04-03 MED ORDER — HYDROCODONE-ACETAMINOPHEN 10-325 MG PO TABS: ORAL_TABLET | ORAL | Status: AC

## 2012-04-03 NOTE — Progress Notes (Signed)
This office note has been dictated.

## 2012-04-04 NOTE — Progress Notes (Signed)
DIAGNOSES: 1. Prothrombin II gene mutation. 2. 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 3. Severe spinal stenosis of the lumbosacral spine.  CURRENT THERAPY:  Lifelong Coumadin to maintain INR between 2-3.  Her current dose is 7.5 mg p.o. daily.  INTERIM HISTORY:  [name] comes in for followup.  Unfortunate, this will be her last visit to [place].  She will be moving to [place].  Her mother is down there.  Her mother is having a lot of health issues.  [name] is really the only one who can help take care of her.  She is really sad about leaving [place].  We have been with her now for about 11 or 12 years.  She is doing okay overall.  She mostly is bothered by the spinal stenosis.  She is on hydrocodone, which has helped her quite a bit.  She has had no problems with bleeding or with the Coumadin.  She has had no leg swelling.  She has had no cough or shortness of breath.  She has had no recurrent sinus issues.  There has been no change in bowel or bladder habits.  PHYSICAL EXAMINATION:  This is a well-developed, well-nourished white female in no obvious distress.  Vital signs:  Temperature of 98, pulse 83, respiratory rate 18, blood pressure 109/69.  Weight is 258. Head/neck:  Normocephalic, atraumatic skull.  There are no ocular or oral lesions.  There are no palpable cervical or supraclavicular lymph nodes.  Lungs:  Clear bilaterally.  Cardiac:  Regular rate and rhythm with a normal S1 and S2.  There are no murmurs, rubs or bruits. Abdomen:  Soft with good bowel sounds.  There is no palpable abdominal mass.  There is no palpable hepatosplenomegaly.  Back:  No tenderness over the spine, ribs, or hips.  She does have some slight spasms in the lumbosacral spine.  Extremities:  No clubbing, cyanosis or edema.  She has no palpable venous cord in her legs.  Neurological:  No focal neurological deficits.  LABORATORY STUDIES:  White cell count is 5.4, hemoglobin  13.6, hematocrit 40.3, platelet count 270.  INR is 3.1.  IMPRESSION:  [name] is a 57 year old white female with prothrombin II gene mutation.  She has not had a deep vein thrombosis now probably for, I think, 10 years.  I am sad to see her leaving.  I do understand that she needs to go to [place] to help her mom.  We will see about finding her a hematologist down in [place].  This should not be too difficult.  She will be living in [place].  As always, we had a good fellowship.  [name] has a very strong faith.  I told [name] that if she ever comes back up to the [place] area, that we will be more than happy to see her.    ______________________________ Josph Macho, M.D. PRE/MEDQ  D:  04/03/2012  T:  04/04/2012  Job:  [id number]

## 2014-10-17 ENCOUNTER — Telehealth: Payer: Self-pay | Admitting: *Deleted

## 2014-10-17 NOTE — Telephone Encounter (Signed)
Patient requesting a refill on her coumadin. She is living in [place] and hasn't been seen in this office since late 2014. Patient instructed that a refill cannot be given for a patient that hasn't been seen in the office for over a year. Suggested that she establish care where she is currently living. Patient hung up the phone.

## 2016-01-03 ENCOUNTER — Encounter: Primary: Family Medicine

## 2016-03-31 ENCOUNTER — Emergency Department: Admit: 2016-03-31 | Discharge: 2016-03-31

## 2016-03-31 ENCOUNTER — Inpatient Hospital Stay: Admit: 2016-03-31 | Discharge: 2016-03-31

## 2016-03-31 DIAGNOSIS — J45909 Unspecified asthma, uncomplicated: Secondary | ICD-10-CM

## 2016-03-31 DIAGNOSIS — Z79899 Other long term (current) drug therapy: Secondary | ICD-10-CM

## 2016-03-31 DIAGNOSIS — Z86711 Personal history of pulmonary embolism: Secondary | ICD-10-CM

## 2016-03-31 DIAGNOSIS — M7989 Other specified soft tissue disorders: Secondary | ICD-10-CM

## 2016-03-31 DIAGNOSIS — Z7901 Long term (current) use of anticoagulants: Secondary | ICD-10-CM

## 2016-03-31 DIAGNOSIS — Z888 Allergy status to other drugs, medicaments and biological substances status: Secondary | ICD-10-CM

## 2016-03-31 DIAGNOSIS — F329 Major depressive disorder, single episode, unspecified: Secondary | ICD-10-CM

## 2016-03-31 DIAGNOSIS — Z86718 Personal history of other venous thrombosis and embolism: Secondary | ICD-10-CM

## 2016-03-31 DIAGNOSIS — M79604 Pain in right leg: Secondary | ICD-10-CM

## 2016-03-31 DIAGNOSIS — F419 Anxiety disorder, unspecified: Secondary | ICD-10-CM

## 2016-03-31 DIAGNOSIS — I82409 Acute embolism and thrombosis of unspecified deep veins of unspecified lower extremity: Principal | ICD-10-CM

## 2016-03-31 DIAGNOSIS — M79661 Pain in right lower leg: Principal | ICD-10-CM

## 2016-03-31 DIAGNOSIS — M87852 Other osteonecrosis, left femur: Secondary | ICD-10-CM

## 2016-03-31 DIAGNOSIS — E669 Obesity, unspecified: Secondary | ICD-10-CM

## 2016-03-31 DIAGNOSIS — Z880 Allergy status to penicillin: Secondary | ICD-10-CM

## 2016-03-31 DIAGNOSIS — R079 Chest pain, unspecified: Secondary | ICD-10-CM

## 2016-03-31 DIAGNOSIS — M8700 Idiopathic aseptic necrosis of unspecified bone: Secondary | ICD-10-CM

## 2016-03-31 DIAGNOSIS — D841 Defects in the complement system: Secondary | ICD-10-CM

## 2016-03-31 MED ORDER — IOHEXOL 350 MG/ML IV SOLN
80 mL | Freq: Once | INTRAVENOUS | Status: CP
Start: 2016-03-31 — End: ?

## 2016-03-31 MED ORDER — PROAIR HFA 108 (90 BASE) MCG/ACT IN AERS
Freq: Four times a day (QID) | RESPIRATORY_TRACT | Status: SS | PRN
Start: 2016-03-31 — End: 2017-12-11

## 2016-03-31 MED ORDER — CLONAZEPAM 1 MG PO TABS
Freq: Two times a day (BID) | ORAL | PRN
Start: 2016-03-31 — End: ?

## 2016-03-31 MED ORDER — OMEPRAZOLE 20 MG PO CPDR
Freq: Every day | ORAL
Start: 2016-03-31 — End: 2018-01-08

## 2016-03-31 MED ORDER — SERTRALINE HCL 100 MG PO TABS
Freq: Every day | ORAL
Start: 2016-03-31 — End: 2018-01-08

## 2016-03-31 MED ORDER — ASPIRIN 81 MG PO TABS
Freq: Every day | ORAL | Status: SS
Start: 2016-03-31 — End: 2017-12-11

## 2016-03-31 MED ORDER — TRIAMCINOLONE ACETONIDE 55 MCG/ACT NA AERO
NASAL
Start: 2016-03-31 — End: ?

## 2016-03-31 NOTE — ED Provider Notes
Decide to obtain previous medical records: Yes - Review and summarize - previous ed visits and clinic notes    Clinical Lab Test(s): Ordered and Reviewed    Diagnostic Tests (Radiology, EKG): Ordered and Reviewed    Independent Visualization (ED US, Wet Prep, Other): No    Discussed patient with NON-ED Provider: no      ED Disposition   ED Disposition: Discharge      ED Clinical Impression   ED Clinical Impression:   Chest pain, unspecified type  Pain of right lower extremity  Leg swelling      ED Patient Status   Patient Status:   Good        ED Medical Evaluation Initiated   Medical Evaluation Initiated:   Yes, filed at 03/31/16 1356  by Rosalita ChessmanBlack, Lauren, MD             Sheppard EvensPapa, Kimberly, MD  Resident  03/31/16 [phone]

## 2016-03-31 NOTE — ED Provider Notes
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 Comparison: None. Technique: Real time ultrasound was used to examine the venous system of the right lower extremity. Gray scale with compression maneuvers, Color and pulsed/spectral Doppler at rest and  with augmentation of the right common femoral, profunda femoral, femoral and popliteal veins was performed. The left common femoral vein was also imaged. Images obtained were stored in a permanent archive. Findings: Right lower extremity: Deep venous system: Common femoral vein: No thrombus is identified in the visualized portions of the common femoral  vein which demonstrates normal compressibility and phasic flow. Femoral vein: No thrombus is identified in the visualized portions of the femoral vein which demonstrates normal compressibility and phasic flow. Popliteal vein: No thrombus is identified in the visualized portions of the popliteal vein which demonstrates normal compressibility and phasic flow. Superficial venous system: The visualized portions of the greater saphenous vein is patent without thrombus. Left lower extremity: Common femoral vein: No thrombus is identified in the visualized portions of the common femoral  vein which demonstrates normal compressibility and phasic flow.     Impression: Negative study for deep venous thrombosis in the right thigh. I personally reviewed the images and the residents findings and agree with the above. Read By Burr Medico- Gregory Wynn M.D.  Electronically Verified By - Burr MedicoGregory Wynn M.D.  Released Date Time - 03/31/2016 3:22 PM  Resident - Lora PaulaAnushi Patel    Per Radiology: Xr Chest Single View    Result Date: 03/31/2016  PORTABLE CHEST X-RAY (SINGLE FRONTAL VIEW) Comparison: None History: SOB (shortness of breath) Findings: The lungs are clear.  The cardiac silhouette, mediastinum, and pulmonary vessels are within normal limits.  The osseous structures are intact.

## 2016-03-31 NOTE — ED Provider Notes
Discussed patient with NON-ED Provider: {SH ED Lamonte SakaiJX MDM - ANOTHER PROVIDER:28381}      ED Disposition   ED Disposition: No ED Disposition Set      ED Clinical Impression   ED Clinical Impression:   Chest pain, unspecified type      ED Patient Status   Patient Status:   [facility][phone]}        ED Medical Evaluation Initiated   Medical Evaluation Initiated:   Yes, filed at 03/31/16 1356  by Rosalita ChessmanBlack, Lauren, MD

## 2016-03-31 NOTE — ED Notes
Back from us; to ct

## 2016-03-31 NOTE — ED Provider Notes
History     Chief Complaint   Patient presents with   ? Leg Pain   ? Shortness of Breath       HPI    Allergies   Allergen Reactions   ? Lovenox [Enoxaparin] Hives   ? Pcn [Penicillins] Hives   ? Sulfa Drugs Nausea And Vomiting       Patient's Medications   New Prescriptions    No medications on file   Previous Medications    ASPIRIN 81 MG PO TABLET    Take by mouth daily.    CLONAZEPAM (KLONOPIN) 1 MG PO TABLET    Take by mouth 2 times daily as needed for anxiety.    OMEPRAZOLE (PRILOSEC) 20 MG PO CAPSULE DELAYED RELEASE    Take by mouth daily.    PROAIR HFA 108 (90 BASE) MCG/ACT IN AEROSOL SOLUTION    Inhale every 6 hours as needed for wheezing.    SERTRALINE (ZOLOFT) 100 MG PO TABLET    Take by mouth daily.    TRIAMCINOLONE (NASACORT) 55 MCG/ACT NA AEROSOL    in each nostril.   Modified Medications    No medications on file   Discontinued Medications    No medications on file       Past Medical History:   Diagnosis Date   ? Anxiety    ? Asthma    ? Avascular necrosis    ? Depression    ? DVT (deep venous thrombosis)    ? Factor H deficiency    ? Factor H deficiency        Past Surgical History:   Procedure Laterality Date   ? CESAREAN SECTION     ? JOINT REPLACEMENT     ? KNEE SURGERY     ? TONSILLECTOMY         No family history on file.    Social History     Social History   ? Marital status: N/A     Spouse name: N/A   ? Number of children: N/A   ? Years of education: N/A     Social History Main Topics   ? Smoking status: Never Smoker   ? Smokeless tobacco: Never Used   ? Alcohol use No   ? Drug use: None   ? Sexual activity: Not Asked     Other Topics Concern   ? None     Social History Narrative   ? None       Review of Systems    Physical Exam     ED Triage Vitals   BP 03/31/16 1308 133/71   Pulse 03/31/16 1308 100   Resp 03/31/16 1308 18   Temp 03/31/16 1308 37.3 ?C (99.1 ?F)   Temp src 03/31/16 1308 Oral   Height 03/31/16 1308 1.676 m   Weight 03/31/16 1308 106.6 kg   SpO2 03/31/16 1308 95 %

## 2016-03-31 NOTE — ED Provider Notes
PROAIR HFA 108 (90 BASE) MCG/ACT IN AEROSOL SOLUTION    Inhale every 6 hours as needed for wheezing.    SERTRALINE (ZOLOFT) 100 MG PO TABLET    Take by mouth daily.    TRIAMCINOLONE (NASACORT) 55 MCG/ACT NA AEROSOL    in each nostril.   Modified Medications    No medications on file   Discontinued Medications    No medications on file       Past Medical History:   Diagnosis Date   ? Anxiety    ? Asthma    ? Avascular necrosis    ? Depression    ? DVT (deep venous thrombosis)    ? Factor H deficiency    ? Factor H deficiency        Past Surgical History:   Procedure Laterality Date   ? CESAREAN SECTION     ? JOINT REPLACEMENT     ? KNEE SURGERY     ? TONSILLECTOMY         No family history on file.    Social History     Social History   ? Marital status: Divorced     Spouse name: N/A   ? Number of children: N/A   ? Years of education: N/A     Social History Main Topics   ? Smoking status: Never Smoker   ? Smokeless tobacco: Never Used   ? Alcohol use No   ? Drug use: None   ? Sexual activity: Not Asked     Other Topics Concern   ? None     Social History Narrative   ? None       Review of Systems   Constitutional: Negative for fever, chills and fatigue.   HENT: Negative for nasal congestion and nasal discharge.    Eyes: Negative for pain, redness and visual disturbance.   Respiratory: Positive for shortness of breath. Negative for cough and chest tightness.    Cardiovascular: Positive for chest pain and leg swelling. Negative for palpitations.   Gastrointestinal: Negative for nausea, vomiting, abdominal pain, diarrhea and constipation.   Genitourinary: Negative for dysuria, urgency and frequency.   Musculoskeletal: Positive for peripheral edema. Negative for myalgias, back pain and arthralgias.   Skin: Negative for rash and wound.   Neurological: Negative for syncope, numbness and headaches.   Psychiatric/Behavioral: Negative for agitation and anxiety.   All other systems reviewed and are negative.

## 2016-03-31 NOTE — ED Provider Notes
Physical Exam       ED Triage Vitals   BP 03/31/16 1308 133/71   Pulse 03/31/16 1308 100   Resp 03/31/16 1308 18   Temp 03/31/16 1308 37.3 ?C (99.1 ?F)   Temp src 03/31/16 1308 Oral   Height 03/31/16 1308 1.676 m   Weight 03/31/16 1308 106.6 kg   SpO2 03/31/16 1308 95 %   BMI (Calculated) 03/31/16 1308 38.01             Physical Exam   Constitutional: She is oriented to person, place, and time. She appears well-developed and well-nourished. No distress.   Well eppearing, obese female, sitting in hospital bed, in no acute distress, speaking in full sentences   HENT:   Head: Normocephalic and atraumatic.   Nose: Nose normal.   Mouth/Throat: No oropharyngeal exudate.   Eyes: Conjunctivae and EOM are normal. Pupils are equal, round, and reactive to light. Right eye exhibits no discharge. Left eye exhibits no discharge. No scleral icterus.   Neck: Normal range of motion. Neck supple.   Cardiovascular: Normal rate, regular rhythm and normal heart sounds.  Exam reveals no gallop and no friction rub.    No murmur heard.  Pulmonary/Chest: Effort normal and breath sounds normal. No respiratory distress. She has no wheezes. She has no rales.   Abdominal: Soft. Bowel sounds are normal. She exhibits no distension. There is no tenderness. There is no rebound and no guarding.   Musculoskeletal: Normal range of motion. She exhibits edema (RLL edema) and tenderness (RLL tenderness to palpation). She exhibits no deformity.   + homan's sign. +2 pulses in bilateral DPs. No skin changes. Leg is cold, good color, not cyanotic. FROM of ankle joint and knee joint   Neurological: She is alert and oriented to person, place, and time. Coordination normal.   Skin: Skin is warm and dry. No rash noted. She is not diaphoretic. No erythema.   Psychiatric: She has a normal mood and affect. Her behavior is normal. Thought content normal.   Nursing note and vitals reviewed.      Differential DDx: DVT, PE, MSK pain, ACS and others

## 2016-03-31 NOTE — ED Provider Notes
Is this an Emergent Medical Condition? Yes - Impairment of Bodily Function  409.901 FS  641.19 FS  627.732 (16) FS    ED Workup   Procedures    Labs:  - - No data to display      Imaging (Read by ED Provider):  {Imaging findings:[phone]}      EKG (Read by ED Provider):  {EKG findings:[id number]}        ED Course & Re-Evaluation     ED Course   Comment By Time   61yo F PMH factor H deficiency, not on anticoagulation, DVT/PE, AVN of L hip presents with leg pain, swelling, and SOB worsening for 3 weeks. Patient states this feels like her last DVT. Patient is supposed to be on coumadin but cannot afford to take it. Pain with walking. SOB associated with 3 weeks occasional sharp R side CP, radiating to shoulder and R face. States it is associated with her "panic attacks". Pain not associated with exertion, not relieved by rest. Lasts a coupleo of minutes, and then resolves on its own. No fevers, chills, nausea, vomiting, diarrhea, constipation. Sheppard EvensPapa, Kimberly, MD 02/04 1351   On exam patient resting comfortably, speaking in full sentences. O2 sats 95% on room air. Lungs clear to auscultation. Heart sounds normal. Abdomen soft, nontender, not distended. R leg tender to palpation to mid calf. Mild swelling of R calf. No skin changes, redness, numbness. No pain out of proportion to exam. Pulses equal in bilateral legs. <2 sec capillary refill Sheppard EvensPapa, Kimberly, MD 02/04 1351   Will get CBC, BMP, troponin, EKG, CXR, US doppler R LE and CTA PE Sheppard EvensPapa, Kimberly, MD 02/04 1415         MDM   Decide to obtain history from someone other than the patient: {SH ED Lamonte SakaiJX MDM - OBTAIN ZOXWRUE:[id number]}HISTORY:28378}    Decide to obtain previous medical records: [facility]{SH ED Lamonte SakaiJX MDM - PREVIOUS MED REC - NO WJX:[id number]}YES:28380}    Clinical Lab Test(s): {SH ED Lamonte SakaiJX MDM ORDERED AND REVIEWED:28124}    Diagnostic Tests (Radiology, EKG): {SH ED Lamonte SakaiJX MDM ORDERED AND REVIEWED:28124}    Independent Visualization (ED US, Wet Prep, Other): [facility] ED [facility] MDM NO YES [id number]}WILDCARD:26444}

## 2016-03-31 NOTE — ED Triage Notes
61 yo white female arrived to triage via wheelchair c/o right leg pain x 3 weeks -- gradually worsening. Pt states pain started in calf and now radiates up and down leg. Pt states hx of DVT. Pt also states has not been on coumadin x 2 years. Pt endorses sob and right sided cp with numbness and tingling to upper extremities. Pt denies any lower extremity swelling.

## 2016-03-31 NOTE — ED Notes
Pt to ed co RLE pain feeling like prev DVT. Denies fever or other rcent illness. Denies falls or trauma. [place] Otherwise [place] recnt long trips or travel out of [place]. Denies exp to sick contacts. Denies recent surg or proced. Denies new meds. Denies n/v. Denies cp. Reproted sob. Denies cough or pain with insp. Denies abd paind enies urinary ss reproted normal bms. No redness warmth or swelling present.

## 2016-03-31 NOTE — ED Provider Notes
History     Chief Complaint   Patient presents with   ? Leg Pain   ? Shortness of Breath       HPI Comments: 61yo F PMH factor H deficiency, not on anticoagulation, DVT/PE, AVN of L hip presents with leg pain, swelling, and SOB worsening for 3 weeks. Patient states this feels like her last DVT. Patient is supposed to be on coumadin but cannot afford to take it. Pain with walking. SOB associated with 3 weeks occasional sharp R side CP, radiating to shoulder and R face. States it is associated with her "panic attacks". Pain not associated with exertion, not relieved by rest. Lasts a coupleo of minutes, and then resolves on its own. No fevers, chills, nausea, vomiting, diarrhea, constipation.    Patient is a 61 y.o. female presenting with leg pain. The history is provided by the patient. No language interpreter was used.   Leg Pain   Location:  Leg  Time since incident:  3 weeks  Injury: no    Leg location:  R leg  Pain details:     Quality:  Aching    Radiates to:  Does not radiate    Severity:  Moderate    Onset quality:  Gradual    Duration:  3 weeks    Timing:  Constant    Progression:  Worsening  Chronicity:  Recurrent  Dislocation: no    Foreign body present:  No foreign bodies  Prior injury to area:  No  Relieved by:  Nothing  Worsened by:  Bearing weight and flexion  Ineffective treatments:  None tried  Associated symptoms: swelling    Associated symptoms: no back pain, no decreased ROM, no fatigue, no fever and no numbness    Risk factors: obesity        Allergies   Allergen Reactions   ? Lovenox [Enoxaparin] Hives   ? Pcn [Penicillins] Hives   ? Sulfa Drugs Nausea And Vomiting       Patient's Medications   New Prescriptions    No medications on file   Previous Medications    ASPIRIN 81 MG PO TABLET    Take by mouth daily.    CLONAZEPAM (KLONOPIN) 1 MG PO TABLET    Take by mouth 2 times daily as needed for anxiety.    OMEPRAZOLE (PRILOSEC) 20 MG PO CAPSULE DELAYED RELEASE    Take by mouth daily.

## 2016-03-31 NOTE — ED Provider Notes
ED Triage Vitals   BP 03/31/16 1308 133/71   Pulse 03/31/16 1308 100   Resp 03/31/16 1308 18   Temp 03/31/16 1308 37.3 ?C (99.1 ?F)   Temp src 03/31/16 1308 Oral   Height 03/31/16 1308 1.676 m   Weight 03/31/16 1308 106.6 kg   SpO2 03/31/16 1308 95 %   BMI (Calculated) 03/31/16 1308 38.01             Physical Exam   Constitutional: She is oriented to person, place, and time. She appears well-developed and well-nourished. No distress.   Well eppearing, obese female, sitting in hospital bed, in no acute distress, speaking in full sentences   HENT:   Head: Normocephalic and atraumatic.   Nose: Nose normal.   Mouth/Throat: No oropharyngeal exudate.   Eyes: Conjunctivae and EOM are normal. Pupils are equal, round, and reactive to light. Right eye exhibits no discharge. Left eye exhibits no discharge. No scleral icterus.   Neck: Normal range of motion. Neck supple.   Cardiovascular: Normal rate, regular rhythm and normal heart sounds.  Exam reveals no gallop and no friction rub.    No murmur heard.  Pulmonary/Chest: Effort normal and breath sounds normal. No respiratory distress. She has no wheezes. She has no rales.   Abdominal: Soft. Bowel sounds are normal. She exhibits no distension. There is no tenderness. There is no rebound and no guarding.   Musculoskeletal: Normal range of motion. She exhibits edema (RLL edema) and tenderness (RLL tenderness to palpation). She exhibits no deformity.   + homan's sign. +2 pulses in bilateral DPs. No skin changes. Leg is cold, good color, not cyanotic. FROM of ankle joint and knee joint   Neurological: She is alert and oriented to person, place, and time. Coordination normal.   Skin: Skin is warm and dry. No rash noted. She is not diaphoretic. No erythema.   Psychiatric: She has a normal mood and affect. Her behavior is normal. Thought content normal.   Nursing note and vitals reviewed.      Differential DDx: DVT, PE, MSK pain, ACS and others

## 2016-03-31 NOTE — ED Provider Notes
hip presents with leg pain, swelling, and SOB worsening for 3 weeks. Patient states this feels like her last DVT. Patient is supposed to be on coumadin but cannot afford to take it. Pain with walking. SOB associated with 3 weeks occasional sharp R side CP, radiating to shoulder and R face. States it is associated with her "panic attacks". Pain not associated with exertion, not relieved by rest. Lasts a coupleo of minutes, and then resolves on its own. No fevers, chills, nausea, vomiting, diarrhea, constipation. Sheppard EvensPapa, Kimberly, MD 02/04 1351   On exam patient resting comfortably, speaking in full sentences. O2 sats 95% on room air. Lungs clear to auscultation. Heart sounds normal. Abdomen soft, nontender, not distended. R leg tender to palpation to mid calf. Mild swelling of R calf. No skin changes, redness, numbness. No pain out of proportion to exam. Pulses equal in bilateral legs. <2 sec capillary refill Sheppard EvensPapa, Kimberly, MD 02/04 1351   Will get CBC, BMP, troponin, EKG, CXR, US doppler R LE and CTA PE Sheppard EvensPapa, Kimberly, MD 02/04 1415   Troponin neg x1 Sheppard EvensPapa, Kimberly, MD 02/04 1437   US DVT negative for DVT Sheppard EvensPapa, Kimberly, MD 02/04 1559   EKG NSR 65bpm, normal EKG Sheppard EvensPapa, Kimberly, MD 02/04 1600   CTA negative for PE. HEART score 1. Symptoms have been going on 3-4 weeks, troponin negative. Likely non cardiac chest pain. Will discharge at this time. Sheppard EvensPapa, Kimberly, MD 02/04 [phone]   Patient stable at this time, NAD, VSS and informed of results, and counseled regarding appropriate follow-up and plan at this point. Patient has been given strict return precautions and instructed to follow up on out patient basis, but to return to ED if condition worsens. Patient agrees to current plan and verbalizes their understanding of the instructions and agrees to follow up accordingly. Patient discharged in good condition. Sheppard EvensPapa, Kimberly, MD 02/04 1620         MDM   Decide to obtain history from someone other than the patient: No

## 2016-03-31 NOTE — ED Provider Notes
Impression: No acute cardiopulmonary disease. Read By Alla German- Diana Edgar M.D.  Electronically Verified By - Alla Germaniana Edgar M.D.  Released Date Time - 03/31/2016 2:58 PM  Resident -     Per Radiology: Cta Chest Pe Protocol W/o & W/ Iv Con    Result Date: 03/31/2016  CT ANGIOGRAPHY OF THE CHEST (PULMONARY EMBOLUS PROTOCOL) Clinical History: 60 years Female SOB (shortness of breath) Comparison: None Technique: Spiral, multi-slice images were obtained and reconstructed from the thoracic inlet to the adrenal glands following the administration of intravenous contrast only. 3-D MIP images  were made from the axial postcontrast data set and reviewed.  FINDINGS: Pulmonary Arteries: There are no filling defects within the pulmonary arterial system to suggest pulmonary embolus. Aorta: Mild atherosclerotic calcifications. No aneurysm or dissection of the aortic arch or thoracic aorta is seen.  Thyroid: Superiormost aspect is out of the field-of-view. No abnormalities of the visualized portions. Mediastinum: No pathologically enlarged mediastinal lymph nodes.  Age-related calcifications of  the tracheobronchial tree. The visualized esophagus is normal. Heart: The heart is normal is size. There is no pericardial effusion seen. There is mild calcific ASVD of the coronary vessels. Lungs: No focal consolidation or masses.  No pleural effusion or pneumothorax. Mild biapical emphysematous changes. Upper Abdomen: Limited images of the visualized upper abdomen demonstrate no acute abnormality.  Fatty atrophy of the pancreas. Status post cholecystectomy. Incidental splenule. Osseous structures: No acute or suspicious osseous abnormality. Degenerative changes of the visualized spine.     No pulmonary embolus. Mild coronary arterial calcifications.        EKG (Read by ED Provider):  NSR 65bpm, normal EKG        ED Course & Re-Evaluation     ED Course   Comment By Time   61yo F PMH factor H deficiency, not on anticoagulation, DVT/PE, AVN of L

## 2016-03-31 NOTE — ED Provider Notes
Is this an Emergent Medical Condition? Yes - Impairment of Bodily Function  409.901 FS  641.19 FS  627.732 (16) FS    ED Workup   Procedures    Labs:  -   BASIC METABOLIC PANEL - Abnormal        Result Value Ref Range    Sodium 141  135 - 145 mmol/L    Potassium 3.3  3.3 - 4.6 mmol/L    Chloride 101  101 - 110 mmol/L    CO2 22  21 - 29 mmol/L    Urea Nitrogen 12  6 - 22 mg/dL    Creatinine 0.73  0.51 - 0.95 mg/dL    BUN/Creatinine Ratio 16.4  6.0 - 22.0 (calc)    Glucose 98  71 - 99 mg/dL    Calcium 9.0  8.6 - 10.0 mg/dL    Osmolality Calc 281.0      Anion Gap 18 (*) 4 - 16 mmol/L    EGFR >59  mL/min/1.73M2    Comment:   Reference range: =>90 ml/min/1.73M2  eGFR estimates are unable to accurately differentiate levels of GFR above 60 ml/min/1.73M2.   CBC AUTODIFF - Abnormal     WBC 4.51  4.5 - 11 x10E3/uL    RBC 4.60  4.20 - 5.40 x10E6/uL    Hemoglobin 13.6  12.0 - 16.0 g/dL    Hematocrit 40.8  37.0 - 47.0 %    MCV 88.7  82.0 - 101.0 fl    MCH 29.6  27.0 - 34.0 pg    MCHC 33.3  31.0 - 36.0 g/dL    RDW 12.5  12.0 - 16.1 %    Platelet Count 190  140 - 440 thou/cu mm    MPV 10.3  9.5 - 11.5 fl    nRBC % 0.0  0.0 - 1.0 %    Absolute NRBC Count 0.00      Neutrophils % 54.1  34.0 - 73.0 %    Lymphocytes % 34.4  25.0 - 45.0 %    Monocytes % 10.0 (*) 2.0 - 6.0 %    Eosinophils % 1.1  1.0 - 4.0 %    Immature Granulocytes % 0.2  0.0 - 2.0 %    Neutrophils Absolute 2.44  1.80 - 8.70 x10E3/uL    Lymphocytes Absolute 1.55  x10E3/uL    Monocytes Absolute 0.45  x10E3/uL    Eosinophils Absolute 0.05  x10E3/uL    Basophil Absolute 0.01  x10E3/uL    Basophils % 0.2  0 - 1 %   POCT TROPININ I - Normal    Troponin I (Point of Care) <0.05  0.00 - 0.23 ng/mL   POCT TROPININ I   CBC AND DIFFERENTIAL         Imaging (Read by ED Provider):  Per Radiology: US Venous Doppler Lower Ext Right    Result Date: 03/31/2016  RIGHT LOWER EXTREMITY VENOUS ULTRASOUND: CLINICAL HISTORY: Leg pain.

## 2016-03-31 NOTE — ED Provider Notes
BMI (Calculated) 03/31/16 1308 38.01             Physical Exam    Differential DDx: ***    Is this an Emergent Medical Condition? {SH ED EMERGENT MEDICAL [phone]}  409.901 FS  641.19 FS  627.732 (16) FS    ED Workup   Procedures    Labs:  - - No data to display      Imaging (Read by ED Provider):  {Imaging [phone]}      EKG (Read by ED Provider):  {EKG findings:[phone]}        ED Course & Re-Evaluation     ED Course   Comment By Time   Patient eval Sheppard EvensPapa, Kimberly, MD 02/04 1351   On exam Sheppard EvensPapa, Kimberly, MD 02/04 1351         MDM   Decide to obtain history from someone other than the patient: {SH ED Lamonte SakaiJX MDM - OBTAIN WUJWJXB:14782}HISTORY:28378}    Decide to obtain previous medical records: [facility]{SH ED Lamonte SakaiJX MDM - PREVIOUS MED REC - NO NFA:21308}YES:28380}    Clinical Lab Test(s): {SH ED Lamonte SakaiJX MDM ORDERED AND REVIEWED:28124}    Diagnostic Tests (Radiology, EKG): {SH ED Lamonte SakaiJX MDM ORDERED AND REVIEWED:28124}    Independent Visualization (ED US, Wet Prep, Other): {SH ED Lamonte SakaiJX MDM NO YES MVHQIONG:29528}WILDCARD:26444}    Discussed patient with NON-ED Provider: {SH ED Lamonte SakaiJX MDM - ANOTHER PROVIDER:28381}      ED Disposition   ED Disposition: No ED Disposition Set      ED Clinical Impression   ED Clinical Impression:   Chest pain, unspecified type      ED Patient Status   Patient Status:   {[facility] ED [facility] PATIENT STATUS:[phone]}        ED Medical Evaluation Initiated   Medical Evaluation Initiated:   Yes, filed at 03/31/16 1356  by Rosalita ChessmanBlack, Lauren, MD

## 2016-03-31 NOTE — ED Provider Notes
PROAIR HFA 108 (90 BASE) MCG/ACT IN AEROSOL SOLUTION    Inhale every 6 hours as needed for wheezing.    SERTRALINE (ZOLOFT) 100 MG PO TABLET    Take by mouth daily.    TRIAMCINOLONE (NASACORT) 55 MCG/ACT NA AEROSOL    in each nostril.   Modified Medications    No medications on file   Discontinued Medications    No medications on file       Past Medical History:   Diagnosis Date   ? Anxiety    ? Asthma    ? Avascular necrosis    ? Depression    ? DVT (deep venous thrombosis)    ? Factor H deficiency    ? Factor H deficiency        Past Surgical History:   Procedure Laterality Date   ? CESAREAN SECTION     ? JOINT REPLACEMENT     ? KNEE SURGERY     ? TONSILLECTOMY         No family history on file.    Social History     Social History   ? Marital status: N/A     Spouse name: N/A   ? Number of children: N/A   ? Years of education: N/A     Social History Main Topics   ? Smoking status: Never Smoker   ? Smokeless tobacco: Never Used   ? Alcohol use No   ? Drug use: None   ? Sexual activity: Not Asked     Other Topics Concern   ? None     Social History Narrative   ? None       Review of Systems   Constitutional: Negative for fever, chills and fatigue.   HENT: Negative for nasal congestion and nasal discharge.    Eyes: Negative for pain, redness and visual disturbance.   Respiratory: Positive for shortness of breath. Negative for cough and chest tightness.    Cardiovascular: Positive for chest pain and leg swelling. Negative for palpitations.   Gastrointestinal: Negative for nausea, vomiting, abdominal pain, diarrhea and constipation.   Genitourinary: Negative for dysuria, urgency and frequency.   Musculoskeletal: Positive for peripheral edema. Negative for myalgias, back pain and arthralgias.   Skin: Negative for rash and wound.   Neurological: Negative for syncope, numbness and headaches.   Psychiatric/Behavioral: Negative for agitation and anxiety.   All other systems reviewed and are negative.      Physical Exam

## 2016-03-31 NOTE — ED Notes
Dc home  Verb dc instruction  Amb

## 2016-03-31 NOTE — ED Notes
To us

## 2016-03-31 NOTE — ED Triage Notes
61 yo white female arrived to triage via wheelchair c/o right leg pain x 3 weeks -- gradually worsening. Pt states pain started in calf and now radiates up and down leg. Pt states hx of DVT. Pt also states has not been on coumadin x 2 years. Pt endorses sob and right sided cp with numbness and tingling to upper extremities. Pt denies any lower extremity swelling. Pt a/o x 4, respirations are even and unlabored, NAD.

## 2016-09-20 ENCOUNTER — Emergency Department: Admit: 2016-09-20 | Discharge: 2016-09-20

## 2016-09-20 ENCOUNTER — Inpatient Hospital Stay: Admit: 2016-09-20 | Discharge: 2016-09-20

## 2016-09-20 DIAGNOSIS — M87859 Other osteonecrosis, unspecified femur: Secondary | ICD-10-CM

## 2016-09-20 DIAGNOSIS — F419 Anxiety disorder, unspecified: Secondary | ICD-10-CM

## 2016-09-20 DIAGNOSIS — F329 Major depressive disorder, single episode, unspecified: Secondary | ICD-10-CM

## 2016-09-20 DIAGNOSIS — J45909 Unspecified asthma, uncomplicated: Secondary | ICD-10-CM

## 2016-09-20 DIAGNOSIS — Z86718 Personal history of other venous thrombosis and embolism: Secondary | ICD-10-CM

## 2016-09-20 DIAGNOSIS — D689 Coagulation defect, unspecified: Secondary | ICD-10-CM

## 2016-09-20 DIAGNOSIS — Z7982 Long term (current) use of aspirin: Secondary | ICD-10-CM

## 2016-09-20 DIAGNOSIS — Z79899 Other long term (current) drug therapy: Secondary | ICD-10-CM

## 2016-09-20 DIAGNOSIS — R0602 Shortness of breath: Secondary | ICD-10-CM

## 2016-09-20 DIAGNOSIS — R0789 Other chest pain: Principal | ICD-10-CM

## 2016-09-20 DIAGNOSIS — Z880 Allergy status to penicillin: Secondary | ICD-10-CM

## 2016-09-20 DIAGNOSIS — R42 Dizziness and giddiness: Secondary | ICD-10-CM

## 2016-09-20 DIAGNOSIS — Z888 Allergy status to other drugs, medicaments and biological substances status: Secondary | ICD-10-CM

## 2016-09-20 DIAGNOSIS — R079 Chest pain, unspecified: Secondary | ICD-10-CM

## 2016-09-20 DIAGNOSIS — M8700 Idiopathic aseptic necrosis of unspecified bone: Secondary | ICD-10-CM

## 2016-09-20 DIAGNOSIS — D841 Defects in the complement system: Secondary | ICD-10-CM

## 2016-09-20 DIAGNOSIS — I82409 Acute embolism and thrombosis of unspecified deep veins of unspecified lower extremity: Principal | ICD-10-CM

## 2016-09-20 MED ORDER — HYDROMORPHONE HCL 2 MG/2 ML IJ SOLN JX
1 mg | Freq: Once | INTRAVENOUS | Status: CP
Start: 2016-09-20 — End: ?

## 2016-09-20 MED ORDER — ONDANSETRON 4 MG PO TBDP
4 mg | Freq: Once | ORAL | Status: CP
Start: 2016-09-20 — End: ?

## 2016-09-20 MED ORDER — ACETAMINOPHEN 325 MG PO TABS
650 mg | Freq: Four times a day (QID) | ORAL | 0 refills | Status: CP | PRN
Start: 2016-09-20 — End: 2018-01-08

## 2016-09-20 MED ORDER — BOLUS IV FLUID JX
Freq: Once | INTRAVENOUS | Status: CP
Start: 2016-09-20 — End: ?

## 2016-09-20 MED ORDER — SODIUM CHLORIDE 0.9% FOR FLUSHES
20-180 mL | INTRAVENOUS | Status: CP | PRN
Start: 2016-09-20 — End: ?

## 2016-09-20 MED ORDER — KETOROLAC TROMETHAMINE 15 MG/ML IJ SOLN
15 mg | Freq: Once | INTRAVENOUS | Status: DC
Start: 2016-09-20 — End: 2016-09-20

## 2016-09-20 MED ORDER — IOHEXOL 350 MG/ML IV SOLN SH
100 mL | Freq: Once | INTRAVENOUS | Status: CP
Start: 2016-09-20 — End: ?

## 2016-09-20 NOTE — ED Triage Notes
Pt ambulates to triage with steady gait. C/o right sided chest pain starting yesterday worsening with deep breathing, history of DVT, with shortness of breath and dizziness.

## 2016-09-20 NOTE — ED Notes
IV removed with catheter intact.  Pressure applied, Time of discharge: 1601  PM., Patient discharged to  Home.  Patient discharged  ambulatory. to exit with belongings in  Stable condition.  Patient escorted by  no one., Written discharge instructions given to  patient.  Patient/recipient  verbalizes discharge instructions.

## 2016-09-20 NOTE — ED Notes
Pt ambulated in Unit independently with steady gait. Pt denies any needs at this time. Pt awaiting d/c paperwork.

## 2016-09-20 NOTE — ED Notes
Pt states she has a blood clotting disorder and has a Hx of DVT

## 2016-09-21 NOTE — ED Provider Notes
emphysematous changes to the bilateral lung fields.  No acute abnormalities are seen.  There are no pleural effusions, pneumothorax, or hemothorax seen. Upper Abdomen: Limited evaluation of the upper abdomen demonstrates mild fatty infiltration and  atrophy of the pancreas. The patient is status post cholecystectomy. Osseous structures: Views of the regional skeleton demonstrate degenerative changes without acute fracture or other abnormality to be present. Mild diffuse osteopenia osseous structures. There is superior endplate deformity noted of the midthoracic spine secondary to herniation of multiple Schmorl nodes. No acute fracture on current exam.     No pulmonary emboli. Moderate centrilobular emphysema. Diffuse osteopenia of the osseous structures. I personally reviewed the images and the residents findings and agree with the above. Read By Darral Dash- Jerry Matteo M.D.  Electronically Verified By - Darral DashJerry Matteo M.D.  Released Date Time - 09/20/2016 2:15 PM  Resident - Micheline RoughMarsela Hyska        EKG (Read by ED Provider):  normal EKG, normal sinus rhythm        ED Course & Re-Evaluation     ED Course as of Sep 21 841   Fri Sep 20, 2016   1028 Pt is a 65F with pmh DVT and blood clotting disorder, Factor H deficiency, who p/w CP constant since yesterday. Pleuritic. Movement makes it worse. Sharp R sided. No diaphoresis.   [ZH]   1448 Pt's IV has blown and pt is refusing replacement for repeat troponin. However, initial troponin was drawn well after CP began yesterday. Had discussion with patient about risks of missing an elevated troponin and made shared decision to defer delta troponin. Advised pt to return to ED if symptoms change or worsen. Pt stable for DC home with f/u to PCP.    [ZH]      ED Course User Index  [ZH] Threasa HeadsHester, Zachary, MD         MDM   Decide to obtain history from someone other than the patient: No    Decide to obtain previous medical records: Yes - The review of the records

## 2016-09-21 NOTE — ED Provider Notes
BP (!) 168/91   Pulse 94   Resp 22   Temp 37.1 ?C (98.7 ?F)   Temp src Oral   Height 1.676 m   Weight 106.6 kg   SpO2 94 %   BMI (Calculated) 38.01             Physical Exam    Differential DDx: ***    Is this an Emergent Medical Condition? {SH ED EMERGENT MEDICAL CONDITION:[phone]}  409.901 FS  641.19 FS  627.732 (16) FS    ED Workup   Procedures    Labs:  - - No data to display      Imaging (Read by ED Provider):  {Imaging [phone]}      EKG (Read by ED Provider):  {EKG findings:[phone]}        ED Course & Re-Evaluation     ED Course as of Sep 22 819   Fri Sep 20, 2016   1028 Pt is a 77F with pmh DVT and blood clotting disorder, Factor H deficiency, who p/w CP constant since yesterday. Pleuritic. Movement makes it worse. Sharp R sided. No diaphoresis.   [ZH]   1448 Pt's IV has blown and pt is refusing replacement for repeat troponin. However, initial troponin was drawn well after CP began yesterday. Had discussion with patient about risks of missing an elevated troponin and made shared decision to defer delta troponin. Advised pt to return to ED if symptoms change or worsen. Pt stable for DC home with f/u to PCP.    [ZH]      ED Course User Index  [ZH] Threasa HeadsHester, Zachary, MD         MDM   Decide to obtain history from someone other than the patient: {SH ED Lamonte SakaiJX MDM - OBTAIN ZOXWRUE:45409}HISTORY:28378}    Decide to obtain previous medical records: [facility]{SH ED Lamonte SakaiJX MDM - PREVIOUS MED REC - NO WJX:91478}YES:28380}    Clinical Lab Test(s): {SH ED Lamonte SakaiJX MDM ORDERED AND REVIEWED:28124}    Diagnostic Tests (Radiology, EKG): {SH ED Lamonte SakaiJX MDM ORDERED AND REVIEWED:28124}    Independent Visualization (ED US, Wet Prep, Other): {SH ED Lamonte SakaiJX MDM NO YES GNFAOZHY:86578}WILDCARD:26444}    Discussed patient with NON-ED Provider: {SH ED Lamonte SakaiJX MDM - ANOTHER IONGEXBM:84132}PROVIDER:28381}      ED Disposition   ED Disposition: No ED Disposition Set      ED Clinical Impression   ED Clinical Impression:   Chest discomfort  Chest pain, unspecified type      ED Patient Status   Patient Status:

## 2016-09-21 NOTE — ED Provider Notes
Glucose 102 (*) 71 - 99 mg/dL    Calcium 9.2  8.6 - 10.0 mg/dL    Osmolality Calc 274.8      Anion Gap 13  4 - 16 mmol/L    EGFR >59  mL/min/1.73M2    Comment:   Reference range: =>90 ml/min/1.73M2  eGFR estimates are unable to accurately differentiate levels of GFR above 60 ml/min/1.73M2.   CBC AUTODIFF - Abnormal     WBC 3.95 (*) 4.5 - 11 x10E3/uL    RBC 4.73  4.20 - 5.40 x10E6/uL    Hemoglobin 14.1  12.0 - 16.0 g/dL    Hematocrit 41.4  37.0 - 47.0 %    MCV 87.5  82.0 - 101.0 fl    MCH 29.8  27.0 - 34.0 pg    MCHC 34.1  31.0 - 36.0 g/dL    RDW 12.2  12.0 - 16.1 %    Platelet Count 201  140 - 440 thou/cu mm    MPV 10.2  9.5 - 11.5 fl    nRBC % 0.0  0.0 - 1.0 %    Absolute NRBC Count 0.00      Neutrophils % 59.6  34.0 - 73.0 %    Lymphocytes % 27.1  25.0 - 45.0 %    Monocytes % 8.4 (*) 2.0 - 6.0 %    Eosinophils % 4.1 (*) 1.0 - 4.0 %    Immature Granulocytes % 0.3  0.0 - 2.0 %    Neutrophils Absolute 2.36  1.80 - 8.70 x10E3/uL    Lymphocytes Absolute 1.07  x10E3/uL    Monocytes Absolute 0.33  x10E3/uL    Eosinophils Absolute 0.16  x10E3/uL    Basophil Absolute 0.02  x10E3/uL    Absolute Immature Granulocytes 0.01 (*) 0 - 0 x10E3/uL    Basophils % 0.5  0 - 1 %   PROTIME-INR - Normal    Protime 12.6  11.9 - 14.3 seconds    INR 0.9  0.9 - 1.1   APTT - Normal    PTT 31  24 - 35 seconds   POCT TROPININ I - Normal    Troponin I (Point of Care) <0.05  0.00 - 0.23 ng/mL   POCT TROPININ I - Normal    Troponin I (Point of Care) <0.05  0.00 - 0.23 ng/mL   POCT TROPININ I   POCT TROPININ I   CBC AND DIFFERENTIAL         Imaging (Read by ED Provider):  Per Radiology: US Venous Doppler Lower Ext Bilateral    Result Date: 09/20/2016  BILATERAL LOWER EXTREMITY DEEP VENOUS ULTRASOUND    CLINICAL HISTORY:  History of DVT, blood clotting disorder Technique: Real time ultrasound is used to examine the deep venous system of bilateral lower extremities. Gray scale with compression maneuvers, Color Doppler and Spectral Doppler

## 2016-09-21 NOTE — ED Provider Notes
?   DVT (deep venous thrombosis)    ? Factor H deficiency    ? Factor H deficiency        Past Surgical History:   Procedure Laterality Date   ? CESAREAN SECTION     ? JOINT REPLACEMENT     ? KNEE SURGERY     ? TONSILLECTOMY         No family history on file.    Social History     Social History   ? Marital status: Divorced     Spouse name: N/A   ? Number of children: N/A   ? Years of education: N/A     Social History Main Topics   ? Smoking status: Never Smoker   ? Smokeless tobacco: Never Used   ? Alcohol use Yes      Comment: socially   ? Drug use: Unknown   ? Sexual activity: Not Asked     Other Topics Concern   ? None     Social History Narrative   ? None       Review of Systems   Constitutional: Negative for fever, diaphoresis and fatigue.   Respiratory: Negative for cough and shortness of breath.    Cardiovascular: Positive for chest pain. Negative for palpitations, syncope and near-syncope.   Gastrointestinal: Negative for nausea, vomiting and abdominal pain.   Neurological: Negative for dizziness.   Psychiatric/Behavioral: Negative for altered mental status.       Physical Exam     ED Triage Vitals [09/20/16 0940]   BP (!) 168/91   Pulse 94   Resp 22   Temp 37.1 ?C (98.7 ?F)   Temp src Oral   Height 1.676 m   Weight 106.6 kg   SpO2 94 %   BMI (Calculated) 38.01             Physical Exam    Differential DDx: ***    Is this an Emergent Medical Condition? {SH ED EMERGENT MEDICAL CONDITION:[phone]}  409.901 FS  641.19 FS  627.732 (16) FS    ED Workup   Procedures    Labs:  - - No data to display      Imaging (Read by ED Provider):  {Imaging findings:[phone]}      EKG (Read by ED Provider):  {EKG findings:[phone]}        ED Course & Re-Evaluation     ED Course as of Sep 22 819   Fri Sep 20, 2016   1028 Pt is a 76F with pmh DVT and blood clotting disorder, Factor H deficiency, who p/w CP constant since yesterday. Pleuritic. Movement makes it worse. Sharp R sided. No diaphoresis.   [ZH]

## 2016-09-21 NOTE — ED Provider Notes
History     Chief Complaint   Patient presents with   ? Shortness of Breath   ? Chest Pain       Pt is a 60F with pmh DVT and blood clotting disorder, Factor H deficiency, asthma who p/w CP constant since yesterday. Pleuritic. Movement makes it worse. Sharp R sided. No diaphoresis.       The history is provided by the patient.   Chest Pain   Pain location:  R chest  Pain quality: sharp    Pain radiates to:  Does not radiate  Pain severity:  Moderate  Onset quality:  Gradual  Duration:  1 hour  Timing:  Constant  Progression:  Unchanged  Chronicity:  New  Context: breathing and movement    Relieved by:  None tried  Worsened by:  Nothing  Ineffective treatments:  None tried  Associated symptoms: no abdominal pain, no altered mental status, no cough, no diaphoresis, no dizziness, no fatigue, no fever, no lower extremity edema, no nausea, no near-syncope, no palpitations, no shortness of breath, no syncope and no vomiting    Risk factors: prior DVT/PE    Risk factors: no coronary artery disease and no diabetes mellitus        Allergies   Allergen Reactions   ? Lovenox [Enoxaparin] Hives   ? Pcn [Penicillins] Hives   ? Sulfa Drugs Nausea And Vomiting       Patient's Medications   New Prescriptions    No medications on file   Previous Medications    ASPIRIN 81 MG PO TABLET    Take by mouth daily.    CLONAZEPAM (KLONOPIN) 1 MG PO TABLET    Take by mouth 2 times daily as needed for anxiety.    OMEPRAZOLE (PRILOSEC) 20 MG PO CAPSULE DELAYED RELEASE    Take by mouth daily.    PROAIR HFA 108 (90 BASE) MCG/ACT IN AEROSOL SOLUTION    Inhale every 6 hours as needed for wheezing.    SERTRALINE (ZOLOFT) 100 MG PO TABLET    Take by mouth daily.    TRIAMCINOLONE (NASACORT) 55 MCG/ACT NA AEROSOL    in each nostril.   Modified Medications    No medications on file   Discontinued Medications    No medications on file       Past Medical History:   Diagnosis Date   ? Anxiety    ? Asthma    ? Avascular necrosis    ? Depression

## 2016-09-21 NOTE — ED Provider Notes
PORTABLE CHEST X-RAY (SINGLE FRONTAL VIEW) Comparison: Chest x-ray 03/31/2016 History: SOB (shortness of breath) Findings: The trachea is midline. The cardiomediastinal silhouette is within normal size limits. The lungs are clear. There is no pneumothorax or pleural effusion. There is no acute osseous abnormality.     Impression: No acute cardiopulmonary disease and no significant change compared to prior examination. I personally reviewed the images and the residents findings and agree with the above. Read By Burr Medico- Gregory Wynn M.D.  Electronically Verified By - Burr MedicoGregory Wynn M.D.  Released Date Time - 09/20/2016 12:44 PM  Resident - Floyce StakesKatherine Lambert    Per Radiology: Cta Chest Pe Protocol W/o & W/ Iv Con    Result Date: 09/20/2016  CT ANGIOGRAPHY OF THE CHEST (PULMONARY EMBOLUS PROTOCOL) Clinical History: 61 years Female SOB (shortness of breath) Comparison: CTA PE protocol March 31, 2016. Technique: Spiral, multi-slice images were obtained and reconstructed at 2mm increments from the thoracic inlet to the adrenal glands following the administration of intravenous contrast only. 3-D MIP images were made from the axial postcontrast data set and reviewed.  FINDINGS: Pulmonary Arteries: There are no filling defects within the pulmonary arterial system to suggest pulmonary embolus. Aorta and Great Vessels: No aneurysm or dissection of the aortic arch or thoracic aorta is seen.  The proximal great vessels demonstrate no significant stenoses. Thyroid: No significant abnormalities. Mediastinum/hilum: The hila are normal in appearance bilaterally.  No mediastinal masses or adenopathy is seen.  The visualized tracheobronchial tree is normal.  The visualized esophagus is normal. Heart: The heart is normal is size.  There is no pericardial effusion seen.  The cardiac vessels demonstrate minimal or no calcification. The RV/LV ratio is normal (less than 0.9). Lungs: There are

## 2016-09-21 NOTE — ED Provider Notes
selected below is documented in the ED Note : ( Prior ED visit )    Clinical Lab Test(s): Ordered and Reviewed    Diagnostic Tests (Radiology, EKG): Ordered and Reviewed    Independent Visualization (ED US, Wet Prep, Other): Yes - Documented in ED Provider Note    Discussed patient with NON-ED Provider: None      ED Disposition   ED Disposition: Discharge      ED Clinical Impression   ED Clinical Impression:   Chest discomfort  Chest pain, unspecified type  Chest wall pain  SOB (shortness of breath)      ED Patient Status   Patient Status:   Good        ED Medical Evaluation Initiated   Medical Evaluation Initiated:  Yes, filed at 09/20/16 1011  by Threasa HeadsHester, Zachary, MD            Threasa HeadsHester, Zachary, MD  Resident  09/21/16 [phone]

## 2016-09-21 NOTE — ED Provider Notes
1448 Pt's IV has blown and pt is refusing replacement for repeat troponin. However, initial troponin was drawn well after CP began yesterday. Had discussion with patient about risks of missing an elevated troponin and made shared decision to defer delta troponin. Advised pt to return to ED if symptoms change or worsen. Pt stable for DC home with f/u to PCP.    [ZH]      ED Course User Index  [ZH] Threasa HeadsHester, Zachary, MD         MDM   Decide to obtain history from someone other than the patient: {SH ED Lamonte SakaiJX MDM - OBTAIN ZOXWRUE:45409}HISTORY:28378}    Decide to obtain previous medical records: [facility]{SH ED Lamonte SakaiJX MDM - PREVIOUS MED REC - NO WJX:91478}YES:28380}    Clinical Lab Test(s): {SH ED Lamonte SakaiJX MDM ORDERED AND REVIEWED:28124}    Diagnostic Tests (Radiology, EKG): {SH ED Lamonte SakaiJX MDM ORDERED AND REVIEWED:28124}    Independent Visualization (ED US, Wet Prep, Other): {SH ED Lamonte SakaiJX MDM NO YES GNFAOZHY:86578}WILDCARD:26444}    Discussed patient with NON-ED Provider: {SH ED Lamonte SakaiJX MDM - ANOTHER PROVIDER:28381}      ED Disposition   ED Disposition: No ED Disposition Set      ED Clinical Impression   ED Clinical Impression:   Chest discomfort  Chest pain, unspecified type      ED Patient Status   Patient Status:   [facility][phone]}        ED Medical Evaluation Initiated   Medical Evaluation Initiated:  Yes, filed at 09/20/16 1011  by Threasa HeadsHester, Zachary, MD

## 2016-09-21 NOTE — ED Provider Notes
{  SH ED Lamonte SakaiJX PATIENT STATUS:[phone]}        ED Medical Evaluation Initiated   Medical Evaluation Initiated:  Yes, filed at 09/20/16 1011  by Threasa HeadsHester, Zachary, MD

## 2016-09-21 NOTE — ED Provider Notes
Differential DDx: ***    Is this an Emergent Medical Condition? {SH ED EMERGENT MEDICAL CONDITION:[phone]}  409.901 FS  641.19 FS  627.732 (16) FS    ED Workup   Procedures    Labs:  - - No data to display      Imaging (Read by ED Provider):  {Imaging findings:[phone]}      EKG (Read by ED Provider):  {EKG [phone]}        ED Course & Re-Evaluation          MDM   Decide to obtain history from someone other than the patient: {SH ED Lamonte SakaiJX MDM - OBTAIN UJWJXBJ:47829}HISTORY:28378}    Decide to obtain previous medical records: [facility]{SH ED Lamonte SakaiJX MDM - PREVIOUS MED REC - NO FAO:13086}YES:28380}    Clinical Lab Test(s): {SH ED Lamonte SakaiJX MDM ORDERED AND REVIEWED:28124}    Diagnostic Tests (Radiology, EKG): {SH ED Lamonte SakaiJX MDM ORDERED AND REVIEWED:28124}    Independent Visualization (ED US, Wet Prep, Other): {SH ED Lamonte SakaiJX MDM NO YES VHQIONGE:95284}WILDCARD:26444}    Discussed patient with NON-ED Provider: {SH ED Lamonte SakaiJX MDM - ANOTHER PROVIDER:28381}      ED Disposition   ED Disposition: No ED Disposition Set      ED Clinical Impression   ED Clinical Impression:   Chest discomfort  Chest pain, unspecified type      ED Patient Status   Patient Status:   [phone]}        ED Medical Evaluation Initiated   Medical Evaluation Initiated:  Yes, filed at 09/20/16 1011  by Threasa HeadsHester, Zachary, MD

## 2016-09-21 NOTE — ED Provider Notes
sertraline (ZOLOFT) 100 MG PO Tablet Take by mouth daily.Historical Med      triamcinolone (NASACORT) 55 MCG/ACT NA Aerosol in each nostril.Historical Med             Past Medical History:   Diagnosis Date   ? Anxiety    ? Asthma    ? Avascular necrosis    ? Depression    ? DVT (deep venous thrombosis)    ? Factor H deficiency    ? Factor H deficiency        Past Surgical History:   Procedure Laterality Date   ? CESAREAN SECTION     ? JOINT REPLACEMENT     ? KNEE SURGERY     ? TONSILLECTOMY         No family history on file.    Social History     Social History   ? Marital status: Divorced     Spouse name: N/A   ? Number of children: N/A   ? Years of education: N/A     Social History Main Topics   ? Smoking status: Never Smoker   ? Smokeless tobacco: Never Used   ? Alcohol use Yes      Comment: socially   ? Drug use: Unknown   ? Sexual activity: Not Asked     Other Topics Concern   ? None     Social History Narrative   ? None       Review of Systems   Constitutional: Negative for fever, chills, diaphoresis and fatigue.   HENT: Negative for nasal congestion, sore throat, nasal discharge and neck pain.    Eyes: Negative for visual disturbance.   Respiratory: Negative for cough and shortness of breath.    Cardiovascular: Positive for chest pain. Negative for palpitations, syncope and near-syncope.   Gastrointestinal: Negative for nausea, vomiting, abdominal pain, diarrhea, constipation and blood in stool.   Genitourinary: Negative for dysuria.   Musculoskeletal: Negative for gait problem.   Skin: Negative for wound.   Neurological: Negative for dizziness, syncope, light-headedness and headaches.   Psychiatric/Behavioral: Negative for altered mental status and substance abuse.   Allergic/Immunologic: Negative for immunocompromised state.   All other systems reviewed and are negative.      Physical Exam     ED Triage Vitals [09/20/16 0940]   BP (!) 168/91   Pulse 94   Resp 22   Temp 37.1 ?C (98.7 ?F)   Temp src Oral

## 2016-09-21 NOTE — ED Provider Notes
History     Chief Complaint   Patient presents with   ? Shortness of Breath   ? Chest Pain       HPI    Allergies   Allergen Reactions   ? Lovenox [Enoxaparin] Hives   ? Pcn [Penicillins] Hives   ? Sulfa Drugs Nausea And Vomiting       Patient's Medications   New Prescriptions    No medications on file   Previous Medications    ASPIRIN 81 MG PO TABLET    Take by mouth daily.    CLONAZEPAM (KLONOPIN) 1 MG PO TABLET    Take by mouth 2 times daily as needed for anxiety.    OMEPRAZOLE (PRILOSEC) 20 MG PO CAPSULE DELAYED RELEASE    Take by mouth daily.    PROAIR HFA 108 (90 BASE) MCG/ACT IN AEROSOL SOLUTION    Inhale every 6 hours as needed for wheezing.    SERTRALINE (ZOLOFT) 100 MG PO TABLET    Take by mouth daily.    TRIAMCINOLONE (NASACORT) 55 MCG/ACT NA AEROSOL    in each nostril.   Modified Medications    No medications on file   Discontinued Medications    No medications on file       Past Medical History:   Diagnosis Date   ? Anxiety    ? Asthma    ? Avascular necrosis    ? Depression    ? DVT (deep venous thrombosis)    ? Factor H deficiency    ? Factor H deficiency        Past Surgical History:   Procedure Laterality Date   ? CESAREAN SECTION     ? JOINT REPLACEMENT     ? KNEE SURGERY     ? TONSILLECTOMY         No family history on file.    Social History     Social History   ? Marital status: Divorced     Spouse name: N/A   ? Number of children: N/A   ? Years of education: N/A     Social History Main Topics   ? Smoking status: Never Smoker   ? Smokeless tobacco: Never Used   ? Alcohol use Yes      Comment: socially   ? Drug use: Unknown   ? Sexual activity: Not Asked     Other Topics Concern   ? None     Social History Narrative   ? None       Review of Systems    Physical Exam     ED Triage Vitals [09/20/16 0940]   BP (!) 168/91   Pulse 94   Resp 22   Temp 37.1 ?C (98.7 ?F)   Temp src Oral   Height 1.676 m   Weight 106.6 kg   SpO2 94 %   BMI (Calculated) 38.01             Physical Exam

## 2016-09-21 NOTE — ED Provider Notes
at rest and with augmentation of the common femoral, profunda femoral, femoral and popliteal veins was performed bilaterally. Images were obtained and stored in a permanent archive. Comparison: Ultrasound of the right lower extremity from 03/31/2016. Result: Right lower extremity: Deep venous system: Common femoral vein: No thrombus is identified in the visualized portions of the common femoral  vein which demonstrates normal compressibility and phasic flow on pulsed Doppler evaluation. Femoral vein: No thrombus is identified in the visualized portions of the femoral vein which demonstrates normal compressibility and phasic flow on pulsed Doppler evaluation. Popliteal vein: No thrombus is identified in the visualized portions of the popliteal vein which demonstrates normal compressibility and phasic flow on pulsed Doppler evaluation. Superficial venous system: The visualized portions of the greater saphenous vein is patent without thrombus. Left lower extremity: Deep venous system: Common femoral vein: No thrombus is identified in the visualized portions of the common femoral  vein which demonstrates normal compressibility and phasic flow on pulsed Doppler evaluation. Femoral vein: No thrombus is identified in the visualized portions of the femoral vein which demonstrates normal compressibility and phasic flow on pulsed Doppler evaluation. Popliteal vein: No thrombus is identified in the visualized portions of the popliteal vein which demonstrates normal compressibility and phasic flow on pulsed Doppler evaluation. Superficial venous system: The visualized portions of the greater saphenous vein is patent without thrombus.     Impression: Negative study for acute proximal DVT in both lower extremities Read By - Nigel SloopSmita Sharma  Electronically Verified By - Nigel SloopSmita Sharma  Released Date Time - 09/20/2016 11:26 AM  Resident -     Per Radiology: Xr Chest Single View    Result Date: 09/20/2016

## 2016-09-21 NOTE — ED Provider Notes
Height 1.676 m   Weight 106.6 kg   SpO2 94 %   BMI (Calculated) 38.01             Physical Exam   Constitutional: She is oriented to person, place, and time. She appears well-developed and well-nourished. No distress.   HENT:   Head: Normocephalic and atraumatic.   Right Ear: External ear normal.   Left Ear: External ear normal.   Nose: Nose normal.   Mouth/Throat: Oropharynx is clear and moist. No oropharyngeal exudate.   Eyes: Conjunctivae and EOM are normal. Right eye exhibits no discharge. Left eye exhibits no discharge. No scleral icterus.   Neck: Normal range of motion. No JVD present. No tracheal deviation present.   Cardiovascular: Normal rate, regular rhythm and normal heart sounds.  Exam reveals no gallop and no friction rub.    No murmur heard.  Pulmonary/Chest: Effort normal and breath sounds normal. No stridor. No respiratory distress. She has no wheezes. She has no rales. She exhibits tenderness.   Mild TTP over R chest wall.   Abdominal: Soft. She exhibits no distension and no mass. There is no tenderness. There is no rebound and no guarding.   Musculoskeletal: Normal range of motion. She exhibits no edema.   Neurological: She is alert and oriented to person, place, and time. Coordination normal.   Skin: Skin is warm and dry. She is not diaphoretic.   Psychiatric: She has a normal mood and affect. Her behavior is normal.   Nursing note and vitals reviewed.      Differential DDx: msk CP vs MI, PNA, PTX, PE, GERD, PUD, others      Is this an Emergent Medical Condition? Yes - Dysfunction of Organ or Part  409.901 FS  641.19 FS  627.732 (16) FS    ED Workup   Procedures    Labs:  -   BASIC METABOLIC PANEL - Abnormal        Result Value Ref Range    Sodium 137  135 - 145 mmol/L    Potassium 4.2  3.3 - 4.6 mmol/L    Chloride 99 (*) 101 - 110 mmol/L    CO2 25  21 - 29 mmol/L    Urea Nitrogen 15  6 - 22 mg/dL    Creatinine 8.110.84  9.140.51 - 0.95 mg/dL    BUN/Creatinine Ratio 17.9  6.0 - 22.0 (calc)

## 2016-09-21 NOTE — ED Provider Notes
History     Chief Complaint   Patient presents with   ? Shortness of Breath   ? Chest Pain       Pt is a 87F with pmh DVT and blood clotting disorder, Factor H deficiency, who p/w CP constant since yesterday. Pleuritic. Movement makes it worse. Sharp R sided. No diaphoresis.             Allergies   Allergen Reactions   ? Lovenox [Enoxaparin] Hives   ? Pcn [Penicillins] Hives   ? Sulfa Drugs Nausea And Vomiting       Patient's Medications   New Prescriptions    No medications on file   Previous Medications    ASPIRIN 81 MG PO TABLET    Take by mouth daily.    CLONAZEPAM (KLONOPIN) 1 MG PO TABLET    Take by mouth 2 times daily as needed for anxiety.    OMEPRAZOLE (PRILOSEC) 20 MG PO CAPSULE DELAYED RELEASE    Take by mouth daily.    PROAIR HFA 108 (90 BASE) MCG/ACT IN AEROSOL SOLUTION    Inhale every 6 hours as needed for wheezing.    SERTRALINE (ZOLOFT) 100 MG PO TABLET    Take by mouth daily.    TRIAMCINOLONE (NASACORT) 55 MCG/ACT NA AEROSOL    in each nostril.   Modified Medications    No medications on file   Discontinued Medications    No medications on file       Past Medical History:   Diagnosis Date   ? Anxiety    ? Asthma    ? Avascular necrosis    ? Depression    ? DVT (deep venous thrombosis)    ? Factor H deficiency    ? Factor H deficiency        Past Surgical History:   Procedure Laterality Date   ? CESAREAN SECTION     ? JOINT REPLACEMENT     ? KNEE SURGERY     ? TONSILLECTOMY         No family history on file.    Social History     Social History   ? Marital status: Divorced     Spouse name: N/A   ? Number of children: N/A   ? Years of education: N/A     Social History Main Topics   ? Smoking status: Never Smoker   ? Smokeless tobacco: Never Used   ? Alcohol use Yes      Comment: socially   ? Drug use: Unknown   ? Sexual activity: Not Asked     Other Topics Concern   ? None     Social History Narrative   ? None       Review of Systems    Physical Exam     ED Triage Vitals [09/20/16 0940]

## 2016-09-21 NOTE — ED Provider Notes
History     Chief Complaint   Patient presents with   ? Shortness of Breath   ? Chest Pain       Pt is a 65F with pmh DVT and blood clotting disorder, Factor H deficiency, asthma who p/w CP constant since yesterday. Pleuritic. Movement makes it worse. Sharp R sided. No diaphoresis. +SOB and lightheadedness.      The history is provided by the patient.   Chest Pain   Pain location:  R chest  Pain quality: sharp    Pain radiates to:  Does not radiate  Pain severity:  Moderate  Onset quality:  Gradual  Duration:  1 hour  Timing:  Constant  Progression:  Unchanged  Chronicity:  New  Context: breathing and movement    Relieved by:  None tried  Worsened by:  Nothing  Ineffective treatments:  None tried  Associated symptoms: no abdominal pain, no altered mental status, no cough, no diaphoresis, no dizziness, no fatigue, no fever, no headache, no lower extremity edema, no nausea, no near-syncope, no palpitations, no shortness of breath, no syncope and no vomiting    Risk factors: prior DVT/PE    Risk factors: no coronary artery disease and no diabetes mellitus        Allergies   Allergen Reactions   ? Lovenox [Enoxaparin] Hives   ? Pcn [Penicillins] Hives   ? Sulfa Drugs Nausea And Vomiting       Discharge Medication List as of 09/20/2016  3:22 PM      START taking these medications    Details   acetaminophen (TYLENOL) 325 MG PO Tablet Take 2 tablets by mouth every 6 hours as needed for pain.Disp-30 tablet, R-0, Normal         CONTINUE these medications which have NOT CHANGED    Details   aspirin 81 MG PO Tablet Take by mouth daily.Historical Med      clonazePAM (KlonoPIN) 1 MG PO Tablet Take by mouth 2 times daily as needed for anxiety.Historical Med      omeprazole (PriLOSEC) 20 MG PO Capsule Delayed Release Take by mouth daily.Historical Med      PROAIR HFA 108 (90 Base) MCG/ACT IN Aerosol Solution Inhale every 6 hours as needed for wheezing.DAW, Historical Med

## 2017-12-09 ENCOUNTER — Emergency Department: Admit: 2017-12-09 | Discharge: 2017-12-11

## 2017-12-09 ENCOUNTER — Inpatient Hospital Stay
Admit: 2017-12-09 | Discharge: 2017-12-11 | Disposition: A | Discharge status: Home / Self Care | Admitting: Internal Medicine

## 2017-12-09 DIAGNOSIS — F329 Major depressive disorder, single episode, unspecified: Secondary | ICD-10-CM

## 2017-12-09 DIAGNOSIS — M25552 Pain in left hip: Secondary | ICD-10-CM

## 2017-12-09 DIAGNOSIS — D66 Hereditary factor VIII deficiency: Secondary | ICD-10-CM

## 2017-12-09 DIAGNOSIS — R531 Weakness: Secondary | ICD-10-CM

## 2017-12-09 DIAGNOSIS — Z880 Allergy status to penicillin: Secondary | ICD-10-CM

## 2017-12-09 DIAGNOSIS — R0602 Shortness of breath: Secondary | ICD-10-CM

## 2017-12-09 DIAGNOSIS — Z96642 Presence of left artificial hip joint: Secondary | ICD-10-CM

## 2017-12-09 DIAGNOSIS — F419 Anxiety disorder, unspecified: Secondary | ICD-10-CM

## 2017-12-09 DIAGNOSIS — Z86718 Personal history of other venous thrombosis and embolism: Secondary | ICD-10-CM

## 2017-12-09 DIAGNOSIS — H8110 Benign paroxysmal vertigo, unspecified ear: Secondary | ICD-10-CM

## 2017-12-09 DIAGNOSIS — R55 Syncope and collapse: Secondary | ICD-10-CM

## 2017-12-09 DIAGNOSIS — Z888 Allergy status to other drugs, medicaments and biological substances status: Secondary | ICD-10-CM

## 2017-12-09 DIAGNOSIS — R42 Dizziness and giddiness: Principal | ICD-10-CM

## 2017-12-09 DIAGNOSIS — R079 Chest pain, unspecified: Principal | ICD-10-CM

## 2017-12-09 DIAGNOSIS — J45909 Unspecified asthma, uncomplicated: Secondary | ICD-10-CM

## 2017-12-09 DIAGNOSIS — Z7982 Long term (current) use of aspirin: Secondary | ICD-10-CM

## 2017-12-09 DIAGNOSIS — D841 Defects in the complement system: Secondary | ICD-10-CM

## 2017-12-09 DIAGNOSIS — D689 Coagulation defect, unspecified: Secondary | ICD-10-CM

## 2017-12-09 DIAGNOSIS — I82409 Acute embolism and thrombosis of unspecified deep veins of unspecified lower extremity: Principal | ICD-10-CM

## 2017-12-09 DIAGNOSIS — M8700 Idiopathic aseptic necrosis of unspecified bone: Secondary | ICD-10-CM

## 2017-12-09 MED ORDER — IOHEXOL 350 MG/ML IV SOLN SH
100 mL | Freq: Once | INTRAVENOUS | Status: CP
Start: 2017-12-09 — End: ?

## 2017-12-09 MED ORDER — GLUCOSE 4 G PO CHEW JX
16 g | ORAL | Status: DC | PRN
Start: 2017-12-09 — End: 2017-12-12

## 2017-12-09 MED ORDER — FLUTICASONE PROPIONATE 50 MCG/ACT NA SUSP
2 | Freq: Every day | NASAL | Status: DC
Start: 2017-12-09 — End: 2017-12-12

## 2017-12-09 MED ORDER — PROMETHAZINE HCL 12.5 MG PO TABS
12.5 mg | Freq: Once | ORAL | Status: CP
Start: 2017-12-09 — End: ?

## 2017-12-09 MED ORDER — SERTRALINE HCL 50 MG PO TABS
100 mg | Freq: Every day | ORAL | Status: DC
Start: 2017-12-09 — End: 2017-12-10

## 2017-12-09 MED ORDER — ASPIRIN 81 MG PO CHEW
81 mg | Freq: Every day | ORAL | Status: DC
Start: 2017-12-09 — End: 2017-12-12

## 2017-12-09 MED ORDER — MECLIZINE HCL 25 MG PO TABS
25 mg | Freq: Three times a day (TID) | ORAL | Status: DC | PRN
Start: 2017-12-09 — End: 2017-12-12

## 2017-12-09 MED ORDER — DEXTROSE 50 % IV SOLN
30 mL | INTRAVENOUS | Status: DC | PRN
Start: 2017-12-09 — End: 2017-12-12

## 2017-12-09 MED ORDER — CLONAZEPAM 0.5 MG PO TABS
1 mg | Freq: Two times a day (BID) | ORAL | Status: DC | PRN
Start: 2017-12-09 — End: 2017-12-12

## 2017-12-09 MED ORDER — ONDANSETRON HCL 4 MG/2ML IJ SOLN
4 mg | Freq: Once | INTRAVENOUS | Status: CP
Start: 2017-12-09 — End: ?

## 2017-12-09 MED ORDER — HYDROCODONE-ACETAMINOPHEN 5-325 MG PO TABS
1 | ORAL_TABLET | Freq: Once | ORAL | Status: CP
Start: 2017-12-09 — End: ?

## 2017-12-09 MED ORDER — MUPIROCIN 2 % EX OINT
Freq: Two times a day (BID) | NASAL | Status: DC
Start: 2017-12-09 — End: 2017-12-12

## 2017-12-09 MED ORDER — SODIUM CHLORIDE 0.9% FOR FLUSHES
20-180 mL | INTRAVENOUS | Status: CP | PRN
Start: 2017-12-09 — End: ?

## 2017-12-09 MED ORDER — ALBUTEROL SULFATE HFA 108 (90 BASE) MCG/ACT IN AERS
1 | Freq: Four times a day (QID) | RESPIRATORY_TRACT | Status: DC | PRN
Start: 2017-12-09 — End: 2017-12-11

## 2017-12-10 DIAGNOSIS — J45909 Unspecified asthma, uncomplicated: Secondary | ICD-10-CM

## 2017-12-10 DIAGNOSIS — Z96642 Presence of left artificial hip joint: Secondary | ICD-10-CM

## 2017-12-10 DIAGNOSIS — H8110 Benign paroxysmal vertigo, unspecified ear: Secondary | ICD-10-CM

## 2017-12-10 DIAGNOSIS — F329 Major depressive disorder, single episode, unspecified: Secondary | ICD-10-CM

## 2017-12-10 DIAGNOSIS — F419 Anxiety disorder, unspecified: Secondary | ICD-10-CM

## 2017-12-10 DIAGNOSIS — Z880 Allergy status to penicillin: Secondary | ICD-10-CM

## 2017-12-10 DIAGNOSIS — Z86718 Personal history of other venous thrombosis and embolism: Secondary | ICD-10-CM

## 2017-12-10 DIAGNOSIS — Z888 Allergy status to other drugs, medicaments and biological substances status: Secondary | ICD-10-CM

## 2017-12-10 DIAGNOSIS — R42 Dizziness and giddiness: Principal | ICD-10-CM

## 2017-12-10 DIAGNOSIS — Z7982 Long term (current) use of aspirin: Secondary | ICD-10-CM

## 2017-12-10 DIAGNOSIS — M25552 Pain in left hip: Secondary | ICD-10-CM

## 2017-12-10 DIAGNOSIS — D66 Hereditary factor VIII deficiency: Secondary | ICD-10-CM

## 2017-12-10 MED ORDER — ALBUTEROL SULFATE (2.5 MG/3ML) 0.083% IN NEBU
2.5 mg | Freq: Four times a day (QID) | RESPIRATORY_TRACT | Status: DC | PRN
Start: 2017-12-10 — End: 2017-12-12

## 2017-12-10 MED ORDER — CYANOCOBALAMIN 1000 MCG/ML IJ SOLN
1000 ug | Freq: Once | INTRAMUSCULAR | Status: CP
Start: 2017-12-10 — End: ?

## 2017-12-10 MED ORDER — FLUTICASONE PROPIONATE 50 MCG/ACT NA SUSP
2 | Freq: Every day | NASAL | 1 refills | Status: CN
Start: 2017-12-10 — End: ?

## 2017-12-10 MED ORDER — ALBUTEROL SULFATE HFA 108 (90 BASE) MCG/ACT IN AERS
1 | Freq: Four times a day (QID) | RESPIRATORY_TRACT | 2 refills | Status: CN | PRN
Start: 2017-12-10 — End: ?

## 2017-12-10 MED ORDER — PROMETHAZINE HCL 12.5 MG PO TABS
12.5 mg | Freq: Once | ORAL | Status: CP
Start: 2017-12-10 — End: ?

## 2017-12-10 MED ORDER — INFLUENZA VAC SPLIT QUAD 0.5 ML IM SUSY
0.5 mL | Freq: Once | INTRAMUSCULAR | Status: CP
Start: 2017-12-10 — End: ?

## 2017-12-10 MED ORDER — SERTRALINE HCL 50 MG PO TABS
100 mg | Freq: Every evening | ORAL | Status: DC
Start: 2017-12-10 — End: 2017-12-12

## 2017-12-10 MED ORDER — MECLIZINE HCL 25 MG PO TABS
25 mg | Freq: Three times a day (TID) | ORAL | 0 refills | Status: CN | PRN
Start: 2017-12-10 — End: ?

## 2017-12-11 MED ORDER — ASPIRIN 81 MG PO CHEW
81 mg | Freq: Every day | ORAL | 2 refills | Status: CP
Start: 2017-12-11 — End: 2018-01-08

## 2017-12-11 MED ORDER — ALBUTEROL SULFATE HFA 108 (90 BASE) MCG/ACT IN AERS
2 | Freq: Four times a day (QID) | RESPIRATORY_TRACT | 0 refills | Status: CP | PRN
Start: 2017-12-11 — End: ?

## 2017-12-11 MED ORDER — MECLIZINE HCL 25 MG PO TABS
25 mg | Freq: Three times a day (TID) | ORAL | 0 refills | Status: CP | PRN
Start: 2017-12-11 — End: 2018-01-08

## 2017-12-11 MED ORDER — CYANOCOBALAMIN 1000 MCG PO TABS
1000 ug | Freq: Every day | ORAL | 2 refills | Status: CP
Start: 2017-12-11 — End: ?

## 2017-12-11 MED ORDER — ACETAMINOPHEN 325 MG PO TABS
650 mg | ORAL | Status: DC | PRN
Start: 2017-12-11 — End: 2017-12-12

## 2017-12-15 ENCOUNTER — Encounter: Primary: Family Medicine

## 2017-12-16 ENCOUNTER — Encounter: Primary: Family Medicine

## 2017-12-23 ENCOUNTER — Encounter: Attending: Internal Medicine | Primary: Family Medicine

## 2017-12-25 ENCOUNTER — Inpatient Hospital Stay: Admit: 2017-12-25 | Discharge: 2017-12-25 | Primary: Family Medicine

## 2017-12-25 DIAGNOSIS — R42 Dizziness and giddiness: Principal | ICD-10-CM

## 2017-12-31 ENCOUNTER — Inpatient Hospital Stay: Admit: 2017-12-31 | Discharge: 2018-01-24 | Primary: Family Medicine

## 2017-12-31 ENCOUNTER — Encounter: Admit: 2017-12-31 | Discharge: 2018-01-24 | Primary: Family Medicine

## 2017-12-31 DIAGNOSIS — R42 Dizziness and giddiness: Principal | ICD-10-CM

## 2018-01-02 DIAGNOSIS — R42 Dizziness and giddiness: Principal | ICD-10-CM

## 2018-01-07 ENCOUNTER — Ambulatory Visit: Admit: 2018-01-07 | Discharge: 2018-01-07 | Attending: Family Medicine | Primary: Family Medicine

## 2018-01-07 DIAGNOSIS — F419 Anxiety disorder, unspecified: Secondary | ICD-10-CM

## 2018-01-07 DIAGNOSIS — K589 Irritable bowel syndrome without diarrhea: Secondary | ICD-10-CM

## 2018-01-07 DIAGNOSIS — M1611 Unilateral primary osteoarthritis, right hip: Secondary | ICD-10-CM

## 2018-01-07 DIAGNOSIS — Z79899 Other long term (current) drug therapy: Secondary | ICD-10-CM

## 2018-01-07 DIAGNOSIS — R42 Dizziness and giddiness: Secondary | ICD-10-CM

## 2018-01-07 DIAGNOSIS — J449 Chronic obstructive pulmonary disease, unspecified: Secondary | ICD-10-CM

## 2018-01-07 DIAGNOSIS — M8700 Idiopathic aseptic necrosis of unspecified bone: Principal | ICD-10-CM

## 2018-01-07 DIAGNOSIS — Z86718 Personal history of other venous thrombosis and embolism: Secondary | ICD-10-CM

## 2018-01-07 DIAGNOSIS — Z1231 Encounter for screening mammogram for malignant neoplasm of breast: Secondary | ICD-10-CM

## 2018-01-07 DIAGNOSIS — F329 Major depressive disorder, single episode, unspecified: Secondary | ICD-10-CM

## 2018-01-07 DIAGNOSIS — D6859 Other primary thrombophilia: Secondary | ICD-10-CM

## 2018-01-07 DIAGNOSIS — R930 Abnormal findings on diagnostic imaging of skull and head, not elsewhere classified: Secondary | ICD-10-CM

## 2018-01-07 DIAGNOSIS — Z7982 Long term (current) use of aspirin: Secondary | ICD-10-CM

## 2018-01-07 DIAGNOSIS — D841 Defects in the complement system: Secondary | ICD-10-CM

## 2018-01-07 DIAGNOSIS — I82511 Chronic embolism and thrombosis of right femoral vein: Secondary | ICD-10-CM

## 2018-01-07 DIAGNOSIS — Z96642 Presence of left artificial hip joint: Secondary | ICD-10-CM

## 2018-01-07 DIAGNOSIS — J45909 Unspecified asthma, uncomplicated: Secondary | ICD-10-CM

## 2018-01-07 DIAGNOSIS — R9089 Other abnormal findings on diagnostic imaging of central nervous system: Secondary | ICD-10-CM

## 2018-01-07 DIAGNOSIS — I82409 Acute embolism and thrombosis of unspecified deep veins of unspecified lower extremity: Principal | ICD-10-CM

## 2018-01-07 MED ORDER — ASPIRIN 81 MG PO CHEW
81 mg | Freq: Every day | ORAL | 2 refills | Status: CP
Start: 2018-01-07 — End: ?

## 2018-01-07 MED ORDER — PROVENTIL HFA 108 (90 BASE) MCG/ACT IN AERS
2 | RESPIRATORY_TRACT | 11 refills | Status: CP | PRN
Start: 2018-01-07 — End: ?

## 2018-01-07 MED ORDER — ACETAMINOPHEN 325 MG PO TABS
650 mg | Freq: Four times a day (QID) | ORAL | 0 refills | Status: CP | PRN
Start: 2018-01-07 — End: ?

## 2018-01-07 MED ORDER — MECLIZINE HCL 25 MG PO TABS
25 mg | Freq: Three times a day (TID) | ORAL | 0 refills | Status: CP | PRN
Start: 2018-01-07 — End: ?

## 2018-01-07 MED ORDER — OMEPRAZOLE 20 MG PO CPDR
20 mg | Freq: Every day | ORAL | 3 refills | Status: CP
Start: 2018-01-07 — End: ?

## 2018-01-07 MED ORDER — SERTRALINE HCL 100 MG PO TABS
200 mg | Freq: Every day | ORAL | 3 refills | Status: CP
Start: 2018-01-07 — End: ?

## 2018-01-10 ENCOUNTER — Inpatient Hospital Stay: Admit: 2018-01-10 | Discharge: 2018-01-11 | Primary: Family Medicine

## 2018-01-10 DIAGNOSIS — D841 Defects in the complement system: Secondary | ICD-10-CM

## 2018-01-10 DIAGNOSIS — I7789 Other specified disorders of arteries and arterioles: Secondary | ICD-10-CM

## 2018-01-10 DIAGNOSIS — M8700 Idiopathic aseptic necrosis of unspecified bone: Secondary | ICD-10-CM

## 2018-01-10 DIAGNOSIS — F329 Major depressive disorder, single episode, unspecified: Secondary | ICD-10-CM

## 2018-01-10 DIAGNOSIS — R9089 Other abnormal findings on diagnostic imaging of central nervous system: Principal | ICD-10-CM

## 2018-01-10 DIAGNOSIS — F419 Anxiety disorder, unspecified: Secondary | ICD-10-CM

## 2018-01-10 DIAGNOSIS — J45909 Unspecified asthma, uncomplicated: Secondary | ICD-10-CM

## 2018-01-10 DIAGNOSIS — I671 Cerebral aneurysm, nonruptured: Secondary | ICD-10-CM

## 2018-01-10 DIAGNOSIS — I82409 Acute embolism and thrombosis of unspecified deep veins of unspecified lower extremity: Principal | ICD-10-CM

## 2018-01-12 DIAGNOSIS — I671 Cerebral aneurysm, nonruptured: Principal | ICD-10-CM

## 2018-01-13 ENCOUNTER — Inpatient Hospital Stay: Admit: 2018-01-13 | Discharge: 2018-01-14 | Primary: Family Medicine

## 2018-01-13 DIAGNOSIS — F419 Anxiety disorder, unspecified: Secondary | ICD-10-CM

## 2018-01-13 DIAGNOSIS — J45909 Unspecified asthma, uncomplicated: Secondary | ICD-10-CM

## 2018-01-13 DIAGNOSIS — I82409 Acute embolism and thrombosis of unspecified deep veins of unspecified lower extremity: Principal | ICD-10-CM

## 2018-01-13 DIAGNOSIS — D841 Defects in the complement system: Secondary | ICD-10-CM

## 2018-01-13 DIAGNOSIS — M8700 Idiopathic aseptic necrosis of unspecified bone: Secondary | ICD-10-CM

## 2018-01-13 DIAGNOSIS — Z1231 Encounter for screening mammogram for malignant neoplasm of breast: Principal | ICD-10-CM

## 2018-01-13 DIAGNOSIS — Z803 Family history of malignant neoplasm of breast: Secondary | ICD-10-CM

## 2018-01-13 DIAGNOSIS — F329 Major depressive disorder, single episode, unspecified: Secondary | ICD-10-CM

## 2018-01-13 DIAGNOSIS — R42 Dizziness and giddiness: Principal | ICD-10-CM

## 2018-01-15 DIAGNOSIS — R42 Dizziness and giddiness: Principal | ICD-10-CM

## 2018-01-20 DIAGNOSIS — R42 Dizziness and giddiness: Principal | ICD-10-CM

## 2018-01-27 ENCOUNTER — Encounter: Primary: Family Medicine

## 2018-01-27 ENCOUNTER — Encounter: Attending: Physician Assistant | Primary: Family Medicine

## 2018-01-27 ENCOUNTER — Ambulatory Visit: Admit: 2018-01-27 | Discharge: 2018-01-27 | Attending: Internal Medicine | Primary: Family Medicine

## 2018-01-27 ENCOUNTER — Ambulatory Visit: Admit: 2018-01-27 | Discharge: 2018-01-27 | Attending: Physician Assistant | Primary: Family Medicine

## 2018-01-27 DIAGNOSIS — M8700 Idiopathic aseptic necrosis of unspecified bone: Secondary | ICD-10-CM

## 2018-01-27 DIAGNOSIS — F419 Anxiety disorder, unspecified: Secondary | ICD-10-CM

## 2018-01-27 DIAGNOSIS — Z79899 Other long term (current) drug therapy: Secondary | ICD-10-CM

## 2018-01-27 DIAGNOSIS — F329 Major depressive disorder, single episode, unspecified: Secondary | ICD-10-CM

## 2018-01-27 DIAGNOSIS — K589 Irritable bowel syndrome without diarrhea: Principal | ICD-10-CM

## 2018-01-27 DIAGNOSIS — Z87891 Personal history of nicotine dependence: Secondary | ICD-10-CM

## 2018-01-27 DIAGNOSIS — Z7982 Long term (current) use of aspirin: Secondary | ICD-10-CM

## 2018-01-27 DIAGNOSIS — F1290 Cannabis use, unspecified, uncomplicated: Secondary | ICD-10-CM

## 2018-01-27 DIAGNOSIS — D6852 Prothrombin gene mutation: Secondary | ICD-10-CM

## 2018-01-27 DIAGNOSIS — K921 Melena: Secondary | ICD-10-CM

## 2018-01-27 DIAGNOSIS — J45909 Unspecified asthma, uncomplicated: Secondary | ICD-10-CM

## 2018-01-27 DIAGNOSIS — R12 Heartburn: Secondary | ICD-10-CM

## 2018-01-27 DIAGNOSIS — J449 Chronic obstructive pulmonary disease, unspecified: Secondary | ICD-10-CM

## 2018-01-27 DIAGNOSIS — D841 Defects in the complement system: Secondary | ICD-10-CM

## 2018-01-27 DIAGNOSIS — D6859 Other primary thrombophilia: Principal | ICD-10-CM

## 2018-01-27 DIAGNOSIS — I82511 Chronic embolism and thrombosis of right femoral vein: Secondary | ICD-10-CM

## 2018-01-27 DIAGNOSIS — I82409 Acute embolism and thrombosis of unspecified deep veins of unspecified lower extremity: Principal | ICD-10-CM

## 2018-01-27 DIAGNOSIS — Z86718 Personal history of other venous thrombosis and embolism: Secondary | ICD-10-CM

## 2018-01-27 DIAGNOSIS — R42 Dizziness and giddiness: Secondary | ICD-10-CM

## 2018-01-27 DIAGNOSIS — R1084 Generalized abdominal pain: Principal | ICD-10-CM

## 2018-01-27 DIAGNOSIS — Z6835 Body mass index (BMI) 35.0-35.9, adult: Secondary | ICD-10-CM

## 2018-01-27 DIAGNOSIS — F410 Panic disorder [episodic paroxysmal anxiety] without agoraphobia: Secondary | ICD-10-CM

## 2018-01-27 DIAGNOSIS — R1011 Right upper quadrant pain: Secondary | ICD-10-CM

## 2018-01-27 MED ORDER — PSYLLIUM 51.7 % PO POWD
11 refills | Status: CP
Start: 2018-01-27 — End: ?

## 2018-01-27 MED ORDER — PEG 3350-KCL-NABCB-NACL-NASULF 236 G PO SOLR
0 refills | Status: CP
Start: 2018-01-27 — End: ?

## 2018-02-03 DIAGNOSIS — Z880 Allergy status to penicillin: Secondary | ICD-10-CM

## 2018-02-03 DIAGNOSIS — R0609 Other forms of dyspnea: Secondary | ICD-10-CM

## 2018-02-03 DIAGNOSIS — K648 Other hemorrhoids: Secondary | ICD-10-CM

## 2018-02-03 DIAGNOSIS — Z882 Allergy status to sulfonamides status: Secondary | ICD-10-CM

## 2018-02-03 DIAGNOSIS — K2950 Unspecified chronic gastritis without bleeding: Secondary | ICD-10-CM

## 2018-02-03 DIAGNOSIS — K921 Melena: Secondary | ICD-10-CM

## 2018-02-03 DIAGNOSIS — Z86718 Personal history of other venous thrombosis and embolism: Secondary | ICD-10-CM

## 2018-02-03 DIAGNOSIS — F419 Anxiety disorder, unspecified: Secondary | ICD-10-CM

## 2018-02-03 DIAGNOSIS — Z7982 Long term (current) use of aspirin: Secondary | ICD-10-CM

## 2018-02-03 DIAGNOSIS — Z885 Allergy status to narcotic agent status: Secondary | ICD-10-CM

## 2018-02-03 DIAGNOSIS — Z862 Personal history of diseases of the blood and blood-forming organs and certain disorders involving the immune mechanism: Secondary | ICD-10-CM

## 2018-02-03 DIAGNOSIS — G4730 Sleep apnea, unspecified: Secondary | ICD-10-CM

## 2018-02-03 DIAGNOSIS — R51 Headache: Secondary | ICD-10-CM

## 2018-02-03 DIAGNOSIS — J449 Chronic obstructive pulmonary disease, unspecified: Secondary | ICD-10-CM

## 2018-02-03 DIAGNOSIS — Z888 Allergy status to other drugs, medicaments and biological substances status: Secondary | ICD-10-CM

## 2018-02-03 DIAGNOSIS — Z79899 Other long term (current) drug therapy: Secondary | ICD-10-CM

## 2018-02-03 DIAGNOSIS — F329 Major depressive disorder, single episode, unspecified: Secondary | ICD-10-CM

## 2018-02-03 DIAGNOSIS — Z9889 Other specified postprocedural states: Secondary | ICD-10-CM

## 2018-02-03 DIAGNOSIS — M1990 Unspecified osteoarthritis, unspecified site: Secondary | ICD-10-CM

## 2018-02-03 DIAGNOSIS — Z1211 Encounter for screening for malignant neoplasm of colon: Principal | ICD-10-CM

## 2018-02-03 DIAGNOSIS — Z9089 Acquired absence of other organs: Secondary | ICD-10-CM

## 2018-02-03 DIAGNOSIS — K5730 Diverticulosis of large intestine without perforation or abscess without bleeding: Secondary | ICD-10-CM

## 2018-02-03 DIAGNOSIS — K644 Residual hemorrhoidal skin tags: Secondary | ICD-10-CM

## 2018-02-03 DIAGNOSIS — R1084 Generalized abdominal pain: Secondary | ICD-10-CM

## 2018-02-03 DIAGNOSIS — Z86010 Personal history of colonic polyps: Secondary | ICD-10-CM

## 2018-02-03 DIAGNOSIS — Z9660 Presence of unspecified orthopedic joint implant: Secondary | ICD-10-CM

## 2018-02-04 ENCOUNTER — Ambulatory Visit: Admit: 2018-02-04 | Discharge: 2018-02-04 | Attending: Neurological Surgery | Primary: Family Medicine

## 2018-02-04 DIAGNOSIS — Z87891 Personal history of nicotine dependence: Secondary | ICD-10-CM

## 2018-02-04 DIAGNOSIS — F329 Major depressive disorder, single episode, unspecified: Secondary | ICD-10-CM

## 2018-02-04 DIAGNOSIS — M87859 Other osteonecrosis, unspecified femur: Secondary | ICD-10-CM

## 2018-02-04 DIAGNOSIS — J45909 Unspecified asthma, uncomplicated: Secondary | ICD-10-CM

## 2018-02-04 DIAGNOSIS — F419 Anxiety disorder, unspecified: Secondary | ICD-10-CM

## 2018-02-04 DIAGNOSIS — Z79899 Other long term (current) drug therapy: Secondary | ICD-10-CM

## 2018-02-04 DIAGNOSIS — I82409 Acute embolism and thrombosis of unspecified deep veins of unspecified lower extremity: Principal | ICD-10-CM

## 2018-02-04 DIAGNOSIS — D6852 Prothrombin gene mutation: Secondary | ICD-10-CM

## 2018-02-04 DIAGNOSIS — D682 Hereditary deficiency of other clotting factors: Secondary | ICD-10-CM

## 2018-02-04 DIAGNOSIS — Z86718 Personal history of other venous thrombosis and embolism: Secondary | ICD-10-CM

## 2018-02-04 DIAGNOSIS — I671 Cerebral aneurysm, nonruptured: Principal | ICD-10-CM

## 2018-02-04 DIAGNOSIS — Z7982 Long term (current) use of aspirin: Secondary | ICD-10-CM

## 2018-02-04 DIAGNOSIS — M8700 Idiopathic aseptic necrosis of unspecified bone: Secondary | ICD-10-CM

## 2018-02-04 DIAGNOSIS — M5481 Occipital neuralgia: Secondary | ICD-10-CM

## 2018-02-04 DIAGNOSIS — D841 Defects in the complement system: Secondary | ICD-10-CM

## 2018-02-09 ENCOUNTER — Inpatient Hospital Stay: Admit: 2018-02-09 | Discharge: 2018-02-10 | Primary: Family Medicine

## 2018-02-09 DIAGNOSIS — I671 Cerebral aneurysm, nonruptured: Principal | ICD-10-CM

## 2018-02-10 ENCOUNTER — Inpatient Hospital Stay: Admit: 2018-02-10 | Discharge: 2018-02-11 | Primary: Family Medicine

## 2018-02-10 ENCOUNTER — Encounter: Primary: Family Medicine

## 2018-02-10 DIAGNOSIS — Z9049 Acquired absence of other specified parts of digestive tract: Secondary | ICD-10-CM

## 2018-02-10 DIAGNOSIS — R1011 Right upper quadrant pain: Principal | ICD-10-CM

## 2018-02-10 DIAGNOSIS — R160 Hepatomegaly, not elsewhere classified: Secondary | ICD-10-CM

## 2018-02-10 DIAGNOSIS — K7689 Other specified diseases of liver: Secondary | ICD-10-CM

## 2018-02-12 ENCOUNTER — Inpatient Hospital Stay: Admit: 2018-02-12 | Discharge: 2018-02-13 | Primary: Family Medicine

## 2018-02-12 DIAGNOSIS — M8788 Other osteonecrosis, other site: Secondary | ICD-10-CM

## 2018-02-12 DIAGNOSIS — H9319 Tinnitus, unspecified ear: Secondary | ICD-10-CM

## 2018-02-12 DIAGNOSIS — Z7982 Long term (current) use of aspirin: Secondary | ICD-10-CM

## 2018-02-12 DIAGNOSIS — D841 Defects in the complement system: Secondary | ICD-10-CM

## 2018-02-12 DIAGNOSIS — M8700 Idiopathic aseptic necrosis of unspecified bone: Secondary | ICD-10-CM

## 2018-02-12 DIAGNOSIS — F419 Anxiety disorder, unspecified: Secondary | ICD-10-CM

## 2018-02-12 DIAGNOSIS — Z86718 Personal history of other venous thrombosis and embolism: Secondary | ICD-10-CM

## 2018-02-12 DIAGNOSIS — Z888 Allergy status to other drugs, medicaments and biological substances status: Secondary | ICD-10-CM

## 2018-02-12 DIAGNOSIS — Z880 Allergy status to penicillin: Secondary | ICD-10-CM

## 2018-02-12 DIAGNOSIS — J45909 Unspecified asthma, uncomplicated: Secondary | ICD-10-CM

## 2018-02-12 DIAGNOSIS — I671 Cerebral aneurysm, nonruptured: Principal | ICD-10-CM

## 2018-02-12 DIAGNOSIS — I82409 Acute embolism and thrombosis of unspecified deep veins of unspecified lower extremity: Principal | ICD-10-CM

## 2018-02-12 DIAGNOSIS — Z885 Allergy status to narcotic agent status: Secondary | ICD-10-CM

## 2018-02-12 DIAGNOSIS — F329 Major depressive disorder, single episode, unspecified: Secondary | ICD-10-CM

## 2018-02-12 DIAGNOSIS — Z9889 Other specified postprocedural states: Secondary | ICD-10-CM

## 2018-02-12 MED ORDER — IOHEXOL 300 MG/ML IJ SOLN
450 mL | Freq: Once | INTRA_ARTERIAL | Status: CP
Start: 2018-02-12 — End: ?

## 2018-02-12 MED ORDER — MIDAZOLAM HCL 2 MG/2ML IJ SOLN
.5-1 mg | INTRAVENOUS | Status: CP | PRN
Start: 2018-02-12 — End: ?

## 2018-02-12 MED ORDER — FENTANYL CITRATE INJ 50 MCG/ML CUSTOM AMP/VIAL
25-50 ug | INTRAVENOUS | Status: CP | PRN
Start: 2018-02-12 — End: ?

## 2018-02-12 MED ORDER — NALOXONE HCL 2 MG/2ML IJ SOSY
.2 mg | INTRAVENOUS | Status: CP | PRN
Start: 2018-02-12 — End: ?

## 2018-02-12 MED ORDER — SODIUM CHLORIDE 0.9% FOR FLUSHES
20-180 mL | Status: CP | PRN
Start: 2018-02-12 — End: ?

## 2018-02-12 MED ORDER — LIDOCAINE HCL (PF) 2 % IJ SOLN SH
Freq: Once | SUBCUTANEOUS | Status: CP
Start: 2018-02-12 — End: ?

## 2018-02-12 MED ORDER — HEPARIN (PORCINE) IN NACL 2000-0.9 UNIT/L-% IV SOLN
INTRA_ARTERIAL | Status: CP | PRN
Start: 2018-02-12 — End: ?

## 2018-02-12 MED ORDER — HEPARIN SODIUM (PORCINE) 1000 UNIT/ML IJ SOLN
5000 [IU] | Freq: Once | Status: CP
Start: 2018-02-12 — End: ?

## 2018-02-24 ENCOUNTER — Ambulatory Visit: Admit: 2018-02-24 | Discharge: 2018-02-24 | Attending: Neurological Surgery | Primary: Family Medicine

## 2018-02-24 DIAGNOSIS — I82409 Acute embolism and thrombosis of unspecified deep veins of unspecified lower extremity: Principal | ICD-10-CM

## 2018-02-24 DIAGNOSIS — J45909 Unspecified asthma, uncomplicated: Secondary | ICD-10-CM

## 2018-02-24 DIAGNOSIS — M8700 Idiopathic aseptic necrosis of unspecified bone: Secondary | ICD-10-CM

## 2018-02-24 DIAGNOSIS — F329 Major depressive disorder, single episode, unspecified: Secondary | ICD-10-CM

## 2018-02-24 DIAGNOSIS — Z7982 Long term (current) use of aspirin: Secondary | ICD-10-CM

## 2018-02-24 DIAGNOSIS — D841 Defects in the complement system: Secondary | ICD-10-CM

## 2018-02-24 DIAGNOSIS — F419 Anxiety disorder, unspecified: Secondary | ICD-10-CM

## 2018-02-24 DIAGNOSIS — Z7951 Long term (current) use of inhaled steroids: Secondary | ICD-10-CM

## 2018-02-24 DIAGNOSIS — Z79899 Other long term (current) drug therapy: Secondary | ICD-10-CM

## 2018-02-24 DIAGNOSIS — I671 Cerebral aneurysm, nonruptured: Principal | ICD-10-CM

## 2018-02-26 ENCOUNTER — Encounter: Primary: Family Medicine

## 2018-02-26 DIAGNOSIS — H9190 Unspecified hearing loss, unspecified ear: Principal | ICD-10-CM

## 2018-03-02 DIAGNOSIS — Z880 Allergy status to penicillin: Secondary | ICD-10-CM

## 2018-03-02 DIAGNOSIS — Z862 Personal history of diseases of the blood and blood-forming organs and certain disorders involving the immune mechanism: Secondary | ICD-10-CM

## 2018-03-02 DIAGNOSIS — R1084 Generalized abdominal pain: Secondary | ICD-10-CM

## 2018-03-02 DIAGNOSIS — Z86718 Personal history of other venous thrombosis and embolism: Secondary | ICD-10-CM

## 2018-03-02 DIAGNOSIS — K2950 Unspecified chronic gastritis without bleeding: Secondary | ICD-10-CM

## 2018-03-02 DIAGNOSIS — K644 Residual hemorrhoidal skin tags: Secondary | ICD-10-CM

## 2018-03-02 DIAGNOSIS — Z86010 Personal history of colonic polyps: Secondary | ICD-10-CM

## 2018-03-02 DIAGNOSIS — K5730 Diverticulosis of large intestine without perforation or abscess without bleeding: Secondary | ICD-10-CM

## 2018-03-02 DIAGNOSIS — M1990 Unspecified osteoarthritis, unspecified site: Secondary | ICD-10-CM

## 2018-03-02 DIAGNOSIS — Z888 Allergy status to other drugs, medicaments and biological substances status: Secondary | ICD-10-CM

## 2018-03-02 DIAGNOSIS — Z7982 Long term (current) use of aspirin: Secondary | ICD-10-CM

## 2018-03-02 DIAGNOSIS — F419 Anxiety disorder, unspecified: Secondary | ICD-10-CM

## 2018-03-02 DIAGNOSIS — Z9089 Acquired absence of other organs: Secondary | ICD-10-CM

## 2018-03-02 DIAGNOSIS — K648 Other hemorrhoids: Secondary | ICD-10-CM

## 2018-03-02 DIAGNOSIS — R51 Headache: Secondary | ICD-10-CM

## 2018-03-02 DIAGNOSIS — Z9889 Other specified postprocedural states: Secondary | ICD-10-CM

## 2018-03-02 DIAGNOSIS — Z885 Allergy status to narcotic agent status: Secondary | ICD-10-CM

## 2018-03-02 DIAGNOSIS — K921 Melena: Secondary | ICD-10-CM

## 2018-03-02 DIAGNOSIS — G4730 Sleep apnea, unspecified: Secondary | ICD-10-CM

## 2018-03-02 DIAGNOSIS — Z1211 Encounter for screening for malignant neoplasm of colon: Principal | ICD-10-CM

## 2018-03-02 DIAGNOSIS — Z882 Allergy status to sulfonamides status: Secondary | ICD-10-CM

## 2018-03-02 DIAGNOSIS — Z79899 Other long term (current) drug therapy: Secondary | ICD-10-CM

## 2018-03-02 DIAGNOSIS — J449 Chronic obstructive pulmonary disease, unspecified: Secondary | ICD-10-CM

## 2018-03-02 DIAGNOSIS — Z9660 Presence of unspecified orthopedic joint implant: Secondary | ICD-10-CM

## 2018-03-02 DIAGNOSIS — R0609 Other forms of dyspnea: Secondary | ICD-10-CM

## 2018-03-02 DIAGNOSIS — F329 Major depressive disorder, single episode, unspecified: Secondary | ICD-10-CM

## 2018-03-04 ENCOUNTER — Ambulatory Visit: Admit: 2018-03-04 | Discharge: 2018-03-04 | Attending: Nurse Practitioner | Primary: Family Medicine

## 2018-03-04 DIAGNOSIS — M25552 Pain in left hip: Secondary | ICD-10-CM

## 2018-03-04 DIAGNOSIS — M8700 Idiopathic aseptic necrosis of unspecified bone: Secondary | ICD-10-CM

## 2018-03-04 DIAGNOSIS — Z880 Allergy status to penicillin: Secondary | ICD-10-CM

## 2018-03-04 DIAGNOSIS — F419 Anxiety disorder, unspecified: Secondary | ICD-10-CM

## 2018-03-04 DIAGNOSIS — Z885 Allergy status to narcotic agent status: Secondary | ICD-10-CM

## 2018-03-04 DIAGNOSIS — Z79899 Other long term (current) drug therapy: Secondary | ICD-10-CM

## 2018-03-04 DIAGNOSIS — Z6835 Body mass index (BMI) 35.0-35.9, adult: Secondary | ICD-10-CM

## 2018-03-04 DIAGNOSIS — G8929 Other chronic pain: Secondary | ICD-10-CM

## 2018-03-04 DIAGNOSIS — Z882 Allergy status to sulfonamides status: Secondary | ICD-10-CM

## 2018-03-04 DIAGNOSIS — Z7982 Long term (current) use of aspirin: Secondary | ICD-10-CM

## 2018-03-04 DIAGNOSIS — J449 Chronic obstructive pulmonary disease, unspecified: Secondary | ICD-10-CM

## 2018-03-04 DIAGNOSIS — J45909 Unspecified asthma, uncomplicated: Secondary | ICD-10-CM

## 2018-03-04 DIAGNOSIS — Z888 Allergy status to other drugs, medicaments and biological substances status: Secondary | ICD-10-CM

## 2018-03-04 DIAGNOSIS — D841 Defects in the complement system: Secondary | ICD-10-CM

## 2018-03-04 DIAGNOSIS — Z96642 Presence of left artificial hip joint: Secondary | ICD-10-CM

## 2018-03-04 DIAGNOSIS — I82409 Acute embolism and thrombosis of unspecified deep veins of unspecified lower extremity: Principal | ICD-10-CM

## 2018-03-04 DIAGNOSIS — Z86718 Personal history of other venous thrombosis and embolism: Secondary | ICD-10-CM

## 2018-03-04 DIAGNOSIS — F329 Major depressive disorder, single episode, unspecified: Secondary | ICD-10-CM

## 2018-03-05 ENCOUNTER — Inpatient Hospital Stay: Admit: 2018-03-05 | Discharge: 2018-03-06 | Primary: Family Medicine

## 2018-03-05 DIAGNOSIS — G4730 Sleep apnea, unspecified: Secondary | ICD-10-CM

## 2018-03-05 DIAGNOSIS — G8929 Other chronic pain: Secondary | ICD-10-CM

## 2018-03-05 DIAGNOSIS — Z96642 Presence of left artificial hip joint: Secondary | ICD-10-CM

## 2018-03-05 DIAGNOSIS — K921 Melena: Secondary | ICD-10-CM

## 2018-03-05 DIAGNOSIS — M8700 Idiopathic aseptic necrosis of unspecified bone: Secondary | ICD-10-CM

## 2018-03-05 DIAGNOSIS — R51 Headache: Secondary | ICD-10-CM

## 2018-03-05 DIAGNOSIS — Z9089 Acquired absence of other organs: Secondary | ICD-10-CM

## 2018-03-05 DIAGNOSIS — Z7982 Long term (current) use of aspirin: Secondary | ICD-10-CM

## 2018-03-05 DIAGNOSIS — R1084 Generalized abdominal pain: Secondary | ICD-10-CM

## 2018-03-05 DIAGNOSIS — Z1211 Encounter for screening for malignant neoplasm of colon: Principal | ICD-10-CM

## 2018-03-05 DIAGNOSIS — K2950 Unspecified chronic gastritis without bleeding: Secondary | ICD-10-CM

## 2018-03-05 DIAGNOSIS — J45909 Unspecified asthma, uncomplicated: Secondary | ICD-10-CM

## 2018-03-05 DIAGNOSIS — M1990 Unspecified osteoarthritis, unspecified site: Secondary | ICD-10-CM

## 2018-03-05 DIAGNOSIS — Z01810 Encounter for preprocedural cardiovascular examination: Secondary | ICD-10-CM

## 2018-03-05 DIAGNOSIS — F419 Anxiety disorder, unspecified: Secondary | ICD-10-CM

## 2018-03-05 DIAGNOSIS — Z9660 Presence of unspecified orthopedic joint implant: Secondary | ICD-10-CM

## 2018-03-05 DIAGNOSIS — F329 Major depressive disorder, single episode, unspecified: Secondary | ICD-10-CM

## 2018-03-05 DIAGNOSIS — Z885 Allergy status to narcotic agent status: Secondary | ICD-10-CM

## 2018-03-05 DIAGNOSIS — Z882 Allergy status to sulfonamides status: Secondary | ICD-10-CM

## 2018-03-05 DIAGNOSIS — D6859 Other primary thrombophilia: Secondary | ICD-10-CM

## 2018-03-05 DIAGNOSIS — Z9889 Other specified postprocedural states: Secondary | ICD-10-CM

## 2018-03-05 DIAGNOSIS — Z862 Personal history of diseases of the blood and blood-forming organs and certain disorders involving the immune mechanism: Secondary | ICD-10-CM

## 2018-03-05 DIAGNOSIS — R0609 Other forms of dyspnea: Secondary | ICD-10-CM

## 2018-03-05 DIAGNOSIS — Z79899 Other long term (current) drug therapy: Secondary | ICD-10-CM

## 2018-03-05 DIAGNOSIS — J449 Chronic obstructive pulmonary disease, unspecified: Secondary | ICD-10-CM

## 2018-03-05 DIAGNOSIS — K644 Residual hemorrhoidal skin tags: Secondary | ICD-10-CM

## 2018-03-05 DIAGNOSIS — Z880 Allergy status to penicillin: Secondary | ICD-10-CM

## 2018-03-05 DIAGNOSIS — Z86010 Personal history of colonic polyps: Secondary | ICD-10-CM

## 2018-03-05 DIAGNOSIS — Z86718 Personal history of other venous thrombosis and embolism: Secondary | ICD-10-CM

## 2018-03-05 DIAGNOSIS — K5730 Diverticulosis of large intestine without perforation or abscess without bleeding: Secondary | ICD-10-CM

## 2018-03-05 DIAGNOSIS — K648 Other hemorrhoids: Secondary | ICD-10-CM

## 2018-03-05 DIAGNOSIS — M25552 Pain in left hip: Principal | ICD-10-CM

## 2018-03-05 DIAGNOSIS — Z888 Allergy status to other drugs, medicaments and biological substances status: Secondary | ICD-10-CM

## 2018-03-05 DIAGNOSIS — I82409 Acute embolism and thrombosis of unspecified deep veins of unspecified lower extremity: Principal | ICD-10-CM

## 2018-03-05 DIAGNOSIS — D841 Defects in the complement system: Secondary | ICD-10-CM

## 2018-03-10 ENCOUNTER — Ambulatory Visit: Admit: 2018-03-10 | Discharge: 2018-03-10 | Attending: Internal Medicine | Primary: Family Medicine

## 2018-03-10 DIAGNOSIS — Z86718 Personal history of other venous thrombosis and embolism: Principal | ICD-10-CM

## 2018-03-10 DIAGNOSIS — M8700 Idiopathic aseptic necrosis of unspecified bone: Secondary | ICD-10-CM

## 2018-03-10 DIAGNOSIS — F419 Anxiety disorder, unspecified: Secondary | ICD-10-CM

## 2018-03-10 DIAGNOSIS — J45909 Unspecified asthma, uncomplicated: Secondary | ICD-10-CM

## 2018-03-10 DIAGNOSIS — I82409 Acute embolism and thrombosis of unspecified deep veins of unspecified lower extremity: Principal | ICD-10-CM

## 2018-03-10 DIAGNOSIS — D841 Defects in the complement system: Secondary | ICD-10-CM

## 2018-03-10 DIAGNOSIS — F410 Panic disorder [episodic paroxysmal anxiety] without agoraphobia: Secondary | ICD-10-CM

## 2018-03-10 DIAGNOSIS — F1290 Cannabis use, unspecified, uncomplicated: Secondary | ICD-10-CM

## 2018-03-10 DIAGNOSIS — Z79899 Other long term (current) drug therapy: Secondary | ICD-10-CM

## 2018-03-10 DIAGNOSIS — Z7982 Long term (current) use of aspirin: Secondary | ICD-10-CM

## 2018-03-10 DIAGNOSIS — F329 Major depressive disorder, single episode, unspecified: Secondary | ICD-10-CM

## 2018-03-10 DIAGNOSIS — D6859 Other primary thrombophilia: Secondary | ICD-10-CM

## 2018-03-13 DIAGNOSIS — I671 Cerebral aneurysm, nonruptured: Principal | ICD-10-CM

## 2018-03-18 ENCOUNTER — Encounter: Attending: Neurological Surgery | Primary: Family Medicine

## 2018-03-18 DIAGNOSIS — J449 Chronic obstructive pulmonary disease, unspecified: Principal | ICD-10-CM

## 2018-03-19 ENCOUNTER — Ambulatory Visit

## 2018-03-19 ENCOUNTER — Inpatient Hospital Stay: Admit: 2018-03-19 | Discharge: 2018-03-19 | Primary: Family Medicine

## 2018-03-19 DIAGNOSIS — Z885 Allergy status to narcotic agent status: Secondary | ICD-10-CM

## 2018-03-19 DIAGNOSIS — K2950 Unspecified chronic gastritis without bleeding: Secondary | ICD-10-CM

## 2018-03-19 DIAGNOSIS — R0609 Other forms of dyspnea: Secondary | ICD-10-CM

## 2018-03-19 DIAGNOSIS — Z880 Allergy status to penicillin: Secondary | ICD-10-CM

## 2018-03-19 DIAGNOSIS — Z7982 Long term (current) use of aspirin: Secondary | ICD-10-CM

## 2018-03-19 DIAGNOSIS — F419 Anxiety disorder, unspecified: Secondary | ICD-10-CM

## 2018-03-19 DIAGNOSIS — M8700 Idiopathic aseptic necrosis of unspecified bone: Secondary | ICD-10-CM

## 2018-03-19 DIAGNOSIS — Z9889 Other specified postprocedural states: Secondary | ICD-10-CM

## 2018-03-19 DIAGNOSIS — K921 Melena: Secondary | ICD-10-CM

## 2018-03-19 DIAGNOSIS — I82409 Acute embolism and thrombosis of unspecified deep veins of unspecified lower extremity: Principal | ICD-10-CM

## 2018-03-19 DIAGNOSIS — R51 Headache: Secondary | ICD-10-CM

## 2018-03-19 DIAGNOSIS — D841 Defects in the complement system: Secondary | ICD-10-CM

## 2018-03-19 DIAGNOSIS — Z1211 Encounter for screening for malignant neoplasm of colon: Secondary | ICD-10-CM

## 2018-03-19 DIAGNOSIS — M1990 Unspecified osteoarthritis, unspecified site: Secondary | ICD-10-CM

## 2018-03-19 DIAGNOSIS — J45909 Unspecified asthma, uncomplicated: Secondary | ICD-10-CM

## 2018-03-19 DIAGNOSIS — F329 Major depressive disorder, single episode, unspecified: Secondary | ICD-10-CM

## 2018-03-19 DIAGNOSIS — R1084 Generalized abdominal pain: Secondary | ICD-10-CM

## 2018-03-19 DIAGNOSIS — K648 Other hemorrhoids: Secondary | ICD-10-CM

## 2018-03-19 DIAGNOSIS — Z888 Allergy status to other drugs, medicaments and biological substances status: Secondary | ICD-10-CM

## 2018-03-19 DIAGNOSIS — Z862 Personal history of diseases of the blood and blood-forming organs and certain disorders involving the immune mechanism: Secondary | ICD-10-CM

## 2018-03-19 DIAGNOSIS — K5730 Diverticulosis of large intestine without perforation or abscess without bleeding: Secondary | ICD-10-CM

## 2018-03-19 DIAGNOSIS — K644 Residual hemorrhoidal skin tags: Secondary | ICD-10-CM

## 2018-03-19 DIAGNOSIS — J449 Chronic obstructive pulmonary disease, unspecified: Secondary | ICD-10-CM

## 2018-03-19 DIAGNOSIS — Z882 Allergy status to sulfonamides status: Secondary | ICD-10-CM

## 2018-03-19 DIAGNOSIS — Z9660 Presence of unspecified orthopedic joint implant: Secondary | ICD-10-CM

## 2018-03-19 DIAGNOSIS — Z9089 Acquired absence of other organs: Secondary | ICD-10-CM

## 2018-03-19 DIAGNOSIS — Z86010 Personal history of colonic polyps: Secondary | ICD-10-CM

## 2018-03-19 DIAGNOSIS — G4730 Sleep apnea, unspecified: Secondary | ICD-10-CM

## 2018-03-19 DIAGNOSIS — Z86718 Personal history of other venous thrombosis and embolism: Secondary | ICD-10-CM

## 2018-03-19 DIAGNOSIS — Z79899 Other long term (current) drug therapy: Secondary | ICD-10-CM

## 2018-03-19 MED ORDER — LACTATED RINGERS IV SOLN
Status: DC | PRN
Start: 2018-03-19 — End: 2018-03-19

## 2018-03-19 MED ORDER — LIDOCAINE HCL (PF) 1 % IJ SOLN SH
INTRAVENOUS | Status: DC | PRN
Start: 2018-03-19 — End: 2018-03-19

## 2018-03-19 MED ORDER — PROPOFOL 10 MG/ML IV EMUL CUSTOM SH
Status: DC | PRN
Start: 2018-03-19 — End: 2018-03-19

## 2018-03-20 DIAGNOSIS — J45909 Unspecified asthma, uncomplicated: Secondary | ICD-10-CM

## 2018-03-20 DIAGNOSIS — M8700 Idiopathic aseptic necrosis of unspecified bone: Secondary | ICD-10-CM

## 2018-03-20 DIAGNOSIS — F329 Major depressive disorder, single episode, unspecified: Secondary | ICD-10-CM

## 2018-03-20 DIAGNOSIS — I82409 Acute embolism and thrombosis of unspecified deep veins of unspecified lower extremity: Principal | ICD-10-CM

## 2018-03-20 DIAGNOSIS — F419 Anxiety disorder, unspecified: Secondary | ICD-10-CM

## 2018-03-20 DIAGNOSIS — D841 Defects in the complement system: Secondary | ICD-10-CM

## 2018-03-20 MED ORDER — PROVENTIL HFA 108 (90 BASE) MCG/ACT IN AERS
2 | RESPIRATORY_TRACT | 11 refills | Status: CP | PRN
Start: 2018-03-20 — End: 2018-05-04

## 2018-03-23 ENCOUNTER — Inpatient Hospital Stay: Admit: 2018-03-23 | Discharge: 2018-03-24 | Primary: Family Medicine

## 2018-03-23 DIAGNOSIS — Z96642 Presence of left artificial hip joint: Secondary | ICD-10-CM

## 2018-03-23 DIAGNOSIS — M8700 Idiopathic aseptic necrosis of unspecified bone: Secondary | ICD-10-CM

## 2018-03-23 DIAGNOSIS — M25552 Pain in left hip: Secondary | ICD-10-CM

## 2018-03-23 DIAGNOSIS — M25559 Pain in unspecified hip: Principal | ICD-10-CM

## 2018-03-23 DIAGNOSIS — M87052 Idiopathic aseptic necrosis of left femur: Secondary | ICD-10-CM

## 2018-03-23 DIAGNOSIS — G8929 Other chronic pain: Secondary | ICD-10-CM

## 2018-03-23 MED ORDER — TECHNETIUM TC 99M MEDRONATE IV KIT
26.3 | Freq: Once | INTRAVENOUS | Status: CP
Start: 2018-03-23 — End: ?

## 2018-03-24 ENCOUNTER — Inpatient Hospital Stay: Admit: 2018-03-24 | Discharge: 2018-03-25 | Primary: Family Medicine

## 2018-03-24 DIAGNOSIS — M25559 Pain in unspecified hip: Secondary | ICD-10-CM

## 2018-03-24 DIAGNOSIS — Z96642 Presence of left artificial hip joint: Secondary | ICD-10-CM

## 2018-03-24 DIAGNOSIS — M25552 Pain in left hip: Principal | ICD-10-CM

## 2018-03-25 ENCOUNTER — Ambulatory Visit: Admit: 2018-03-25 | Discharge: 2018-03-25 | Attending: Nurse Practitioner | Primary: Family Medicine

## 2018-03-25 DIAGNOSIS — Z86718 Personal history of other venous thrombosis and embolism: Secondary | ICD-10-CM

## 2018-03-25 DIAGNOSIS — F419 Anxiety disorder, unspecified: Secondary | ICD-10-CM

## 2018-03-25 DIAGNOSIS — E6609 Other obesity due to excess calories: Secondary | ICD-10-CM

## 2018-03-25 DIAGNOSIS — M25552 Pain in left hip: Secondary | ICD-10-CM

## 2018-03-25 DIAGNOSIS — G8929 Other chronic pain: Secondary | ICD-10-CM

## 2018-03-25 DIAGNOSIS — F329 Major depressive disorder, single episode, unspecified: Secondary | ICD-10-CM

## 2018-03-25 DIAGNOSIS — J45909 Unspecified asthma, uncomplicated: Secondary | ICD-10-CM

## 2018-03-25 DIAGNOSIS — Z96642 Presence of left artificial hip joint: Secondary | ICD-10-CM

## 2018-03-25 DIAGNOSIS — Z7982 Long term (current) use of aspirin: Secondary | ICD-10-CM

## 2018-03-25 DIAGNOSIS — Z880 Allergy status to penicillin: Secondary | ICD-10-CM

## 2018-03-25 DIAGNOSIS — Z96649 Presence of unspecified artificial hip joint: Secondary | ICD-10-CM

## 2018-03-25 DIAGNOSIS — Z888 Allergy status to other drugs, medicaments and biological substances status: Secondary | ICD-10-CM

## 2018-03-25 DIAGNOSIS — Z885 Allergy status to narcotic agent status: Secondary | ICD-10-CM

## 2018-03-25 DIAGNOSIS — T84061D Wear of articular bearing surface of internal prosthetic left hip joint, subsequent encounter: Secondary | ICD-10-CM

## 2018-03-25 DIAGNOSIS — Z79899 Other long term (current) drug therapy: Secondary | ICD-10-CM

## 2018-03-25 DIAGNOSIS — M8700 Idiopathic aseptic necrosis of unspecified bone: Secondary | ICD-10-CM

## 2018-03-25 DIAGNOSIS — D841 Defects in the complement system: Secondary | ICD-10-CM

## 2018-03-25 DIAGNOSIS — Z6835 Body mass index (BMI) 35.0-35.9, adult: Secondary | ICD-10-CM

## 2018-03-25 DIAGNOSIS — I82409 Acute embolism and thrombosis of unspecified deep veins of unspecified lower extremity: Principal | ICD-10-CM

## 2018-03-25 DIAGNOSIS — Z882 Allergy status to sulfonamides status: Secondary | ICD-10-CM

## 2018-03-25 DIAGNOSIS — T84069S Wear of articular bearing surface of unspecified internal prosthetic joint, sequela: Principal | ICD-10-CM

## 2018-03-25 DIAGNOSIS — J449 Chronic obstructive pulmonary disease, unspecified: Secondary | ICD-10-CM

## 2018-03-26 DIAGNOSIS — F419 Anxiety disorder, unspecified: Secondary | ICD-10-CM

## 2018-03-26 DIAGNOSIS — F329 Major depressive disorder, single episode, unspecified: Secondary | ICD-10-CM

## 2018-03-26 DIAGNOSIS — M8700 Idiopathic aseptic necrosis of unspecified bone: Secondary | ICD-10-CM

## 2018-03-26 DIAGNOSIS — I82409 Acute embolism and thrombosis of unspecified deep veins of unspecified lower extremity: Principal | ICD-10-CM

## 2018-03-26 DIAGNOSIS — J45909 Unspecified asthma, uncomplicated: Secondary | ICD-10-CM

## 2018-03-26 DIAGNOSIS — D841 Defects in the complement system: Secondary | ICD-10-CM

## 2018-03-30 ENCOUNTER — Ambulatory Visit: Admit: 2018-03-30 | Discharge: 2018-03-30 | Attending: Physician Assistant | Primary: Family Medicine

## 2018-03-30 DIAGNOSIS — R1011 Right upper quadrant pain: Secondary | ICD-10-CM

## 2018-03-30 DIAGNOSIS — K760 Fatty (change of) liver, not elsewhere classified: Secondary | ICD-10-CM

## 2018-03-30 DIAGNOSIS — M8700 Idiopathic aseptic necrosis of unspecified bone: Secondary | ICD-10-CM

## 2018-03-30 DIAGNOSIS — Z7982 Long term (current) use of aspirin: Secondary | ICD-10-CM

## 2018-03-30 DIAGNOSIS — R109 Unspecified abdominal pain: Secondary | ICD-10-CM

## 2018-03-30 DIAGNOSIS — F419 Anxiety disorder, unspecified: Secondary | ICD-10-CM

## 2018-03-30 DIAGNOSIS — Z87891 Personal history of nicotine dependence: Secondary | ICD-10-CM

## 2018-03-30 DIAGNOSIS — R160 Hepatomegaly, not elsewhere classified: Secondary | ICD-10-CM

## 2018-03-30 DIAGNOSIS — J449 Chronic obstructive pulmonary disease, unspecified: Secondary | ICD-10-CM

## 2018-03-30 DIAGNOSIS — Z79899 Other long term (current) drug therapy: Secondary | ICD-10-CM

## 2018-03-30 DIAGNOSIS — I671 Cerebral aneurysm, nonruptured: Secondary | ICD-10-CM

## 2018-03-30 DIAGNOSIS — K580 Irritable bowel syndrome with diarrhea: Secondary | ICD-10-CM

## 2018-03-30 DIAGNOSIS — K589 Irritable bowel syndrome without diarrhea: Secondary | ICD-10-CM

## 2018-03-30 DIAGNOSIS — Z6835 Body mass index (BMI) 35.0-35.9, adult: Secondary | ICD-10-CM

## 2018-03-30 DIAGNOSIS — Z96649 Presence of unspecified artificial hip joint: Secondary | ICD-10-CM

## 2018-03-30 DIAGNOSIS — K5790 Diverticulosis of intestine, part unspecified, without perforation or abscess without bleeding: Principal | ICD-10-CM

## 2018-03-30 DIAGNOSIS — K5900 Constipation, unspecified: Secondary | ICD-10-CM

## 2018-03-30 DIAGNOSIS — Z9049 Acquired absence of other specified parts of digestive tract: Secondary | ICD-10-CM

## 2018-03-30 DIAGNOSIS — D6859 Other primary thrombophilia: Secondary | ICD-10-CM

## 2018-03-30 DIAGNOSIS — T84069S Wear of articular bearing surface of unspecified internal prosthetic joint, sequela: Secondary | ICD-10-CM

## 2018-03-30 DIAGNOSIS — F329 Major depressive disorder, single episode, unspecified: Secondary | ICD-10-CM

## 2018-03-30 DIAGNOSIS — Z86718 Personal history of other venous thrombosis and embolism: Secondary | ICD-10-CM

## 2018-03-30 DIAGNOSIS — M879 Osteonecrosis, unspecified: Secondary | ICD-10-CM

## 2018-04-01 ENCOUNTER — Ambulatory Visit: Admit: 2018-04-01 | Discharge: 2018-04-01 | Attending: Family Medicine | Primary: Family Medicine

## 2018-04-01 DIAGNOSIS — J019 Acute sinusitis, unspecified: Secondary | ICD-10-CM

## 2018-04-01 DIAGNOSIS — J45909 Unspecified asthma, uncomplicated: Secondary | ICD-10-CM

## 2018-04-01 DIAGNOSIS — Z0000 Encounter for general adult medical examination without abnormal findings: Principal | ICD-10-CM

## 2018-04-01 DIAGNOSIS — F329 Major depressive disorder, single episode, unspecified: Secondary | ICD-10-CM

## 2018-04-01 DIAGNOSIS — Z760 Encounter for issue of repeat prescription: Secondary | ICD-10-CM

## 2018-04-01 DIAGNOSIS — M8700 Idiopathic aseptic necrosis of unspecified bone: Secondary | ICD-10-CM

## 2018-04-01 DIAGNOSIS — I82409 Acute embolism and thrombosis of unspecified deep veins of unspecified lower extremity: Principal | ICD-10-CM

## 2018-04-01 DIAGNOSIS — A6000 Herpesviral infection of urogenital system, unspecified: Secondary | ICD-10-CM

## 2018-04-01 DIAGNOSIS — I671 Cerebral aneurysm, nonruptured: Secondary | ICD-10-CM

## 2018-04-01 DIAGNOSIS — D841 Defects in the complement system: Secondary | ICD-10-CM

## 2018-04-01 DIAGNOSIS — F419 Anxiety disorder, unspecified: Secondary | ICD-10-CM

## 2018-04-01 DIAGNOSIS — D6859 Other primary thrombophilia: Secondary | ICD-10-CM

## 2018-04-01 DIAGNOSIS — I728 Aneurysm of other specified arteries: Secondary | ICD-10-CM

## 2018-04-01 DIAGNOSIS — J449 Chronic obstructive pulmonary disease, unspecified: Secondary | ICD-10-CM

## 2018-04-01 DIAGNOSIS — R42 Dizziness and giddiness: Secondary | ICD-10-CM

## 2018-04-01 DIAGNOSIS — M87052 Idiopathic aseptic necrosis of left femur: Secondary | ICD-10-CM

## 2018-04-01 DIAGNOSIS — Z6835 Body mass index (BMI) 35.0-35.9, adult: Secondary | ICD-10-CM

## 2018-04-01 DIAGNOSIS — Z7982 Long term (current) use of aspirin: Secondary | ICD-10-CM

## 2018-04-01 DIAGNOSIS — K589 Irritable bowel syndrome without diarrhea: Secondary | ICD-10-CM

## 2018-04-01 MED ORDER — AZITHROMYCIN 250 MG PO TABS
ORAL | 0 refills | 3.00000 days | Status: CP
Start: 2018-04-01 — End: 2018-04-09

## 2018-04-01 MED ORDER — VALACYCLOVIR HCL 500 MG PO TABS
500 mg | Freq: Three times a day (TID) | ORAL | 2 refills | Status: CP
Start: 2018-04-01 — End: 2018-04-09

## 2018-04-07 ENCOUNTER — Inpatient Hospital Stay: Admit: 2018-04-07 | Discharge: 2018-04-08 | Primary: Family Medicine

## 2018-04-07 DIAGNOSIS — Z0000 Encounter for general adult medical examination without abnormal findings: Principal | ICD-10-CM

## 2018-04-07 DIAGNOSIS — I671 Cerebral aneurysm, nonruptured: Principal | ICD-10-CM

## 2018-04-07 MED ORDER — CLOPIDOGREL BISULFATE 75 MG PO TABS
75 mg | Freq: Every day | ORAL | 7 refills | 90.00000 days | Status: CP
Start: 2018-04-07 — End: 2018-06-10

## 2018-04-07 MED ORDER — ASPIRIN 325 MG PO TABS
325 mg | Freq: Every day | ORAL | 7 refills | 90.00000 days | Status: CP
Start: 2018-04-07 — End: 2018-06-10

## 2018-04-08 DIAGNOSIS — A6000 Herpesviral infection of urogenital system, unspecified: Secondary | ICD-10-CM

## 2018-04-08 DIAGNOSIS — F329 Major depressive disorder, single episode, unspecified: Principal | ICD-10-CM

## 2018-04-08 DIAGNOSIS — R42 Dizziness and giddiness: Principal | ICD-10-CM

## 2018-04-08 DIAGNOSIS — K589 Irritable bowel syndrome without diarrhea: Principal | ICD-10-CM

## 2018-04-08 DIAGNOSIS — J019 Acute sinusitis, unspecified: Secondary | ICD-10-CM

## 2018-04-08 MED ORDER — CYANOCOBALAMIN 1000 MCG PO TABS
1000 ug | Freq: Every day | ORAL | 2 refills | Status: CP
Start: 2018-04-08 — End: 2018-05-04

## 2018-04-09 MED ORDER — SERTRALINE HCL 100 MG PO TABS
200 mg | Freq: Every day | ORAL | 3 refills | 30.00 days | Status: CP
Start: 2018-04-09 — End: 2018-05-04

## 2018-04-09 MED ORDER — PSYLLIUM 51.7 % PO POWD
11 refills
Start: 2018-04-09 — End: ?

## 2018-04-09 MED ORDER — AZITHROMYCIN 250 MG PO TABS
ORAL | 0 refills | 3.00000 days | Status: CP
Start: 2018-04-09 — End: 2018-04-27

## 2018-04-09 MED ORDER — VALACYCLOVIR HCL 500 MG PO TABS
500 mg | Freq: Three times a day (TID) | ORAL | 2 refills | 7.00 days | Status: CP
Start: 2018-04-09 — End: 2018-04-27

## 2018-04-15 ENCOUNTER — Encounter: Attending: Internal Medicine | Primary: Family Medicine

## 2018-04-17 ENCOUNTER — Inpatient Hospital Stay: Admit: 2018-04-17 | Discharge: 2018-04-18 | Primary: Family Medicine

## 2018-04-17 ENCOUNTER — Ambulatory Visit: Attending: Otolaryngology | Primary: Family Medicine

## 2018-04-17 ENCOUNTER — Ambulatory Visit: Attending: Audiologist | Primary: Family Medicine

## 2018-04-17 DIAGNOSIS — H9190 Unspecified hearing loss, unspecified ear: Principal | ICD-10-CM

## 2018-04-17 DIAGNOSIS — J320 Chronic maxillary sinusitis: Secondary | ICD-10-CM

## 2018-04-17 DIAGNOSIS — I671 Cerebral aneurysm, nonruptured: Principal | ICD-10-CM

## 2018-04-17 DIAGNOSIS — F419 Anxiety disorder, unspecified: Secondary | ICD-10-CM

## 2018-04-17 DIAGNOSIS — D841 Defects in the complement system: Secondary | ICD-10-CM

## 2018-04-17 DIAGNOSIS — F329 Major depressive disorder, single episode, unspecified: Secondary | ICD-10-CM

## 2018-04-17 DIAGNOSIS — R42 Dizziness and giddiness: Secondary | ICD-10-CM

## 2018-04-17 DIAGNOSIS — H9313 Tinnitus, bilateral: Secondary | ICD-10-CM

## 2018-04-17 DIAGNOSIS — J45909 Unspecified asthma, uncomplicated: Secondary | ICD-10-CM

## 2018-04-17 DIAGNOSIS — H903 Sensorineural hearing loss, bilateral: Secondary | ICD-10-CM

## 2018-04-17 DIAGNOSIS — I82409 Acute embolism and thrombosis of unspecified deep veins of unspecified lower extremity: Principal | ICD-10-CM

## 2018-04-17 DIAGNOSIS — M8700 Idiopathic aseptic necrosis of unspecified bone: Secondary | ICD-10-CM

## 2018-04-20 ENCOUNTER — Inpatient Hospital Stay: Primary: Family Medicine

## 2018-04-21 ENCOUNTER — Ambulatory Visit

## 2018-04-21 ENCOUNTER — Ambulatory Visit: Admit: 2018-04-21 | Discharge: 2018-04-22 | Primary: Family Medicine

## 2018-04-21 ENCOUNTER — Inpatient Hospital Stay: Admit: 2018-04-21 | Discharge: 2018-04-22 | Primary: Family Medicine

## 2018-04-21 DIAGNOSIS — Z7902 Long term (current) use of antithrombotics/antiplatelets: Secondary | ICD-10-CM

## 2018-04-21 DIAGNOSIS — I671 Cerebral aneurysm, nonruptured: Principal | ICD-10-CM

## 2018-04-21 DIAGNOSIS — M8788 Other osteonecrosis, other site: Secondary | ICD-10-CM

## 2018-04-21 DIAGNOSIS — F329 Major depressive disorder, single episode, unspecified: Secondary | ICD-10-CM

## 2018-04-21 DIAGNOSIS — I728 Aneurysm of other specified arteries: Principal | ICD-10-CM

## 2018-04-21 DIAGNOSIS — Z880 Allergy status to penicillin: Secondary | ICD-10-CM

## 2018-04-21 DIAGNOSIS — Z96642 Presence of left artificial hip joint: Secondary | ICD-10-CM

## 2018-04-21 DIAGNOSIS — G4733 Obstructive sleep apnea (adult) (pediatric): Secondary | ICD-10-CM

## 2018-04-21 DIAGNOSIS — D841 Defects in the complement system: Secondary | ICD-10-CM

## 2018-04-21 DIAGNOSIS — R079 Chest pain, unspecified: Secondary | ICD-10-CM

## 2018-04-21 DIAGNOSIS — D6852 Prothrombin gene mutation: Secondary | ICD-10-CM

## 2018-04-21 DIAGNOSIS — G43909 Migraine, unspecified, not intractable, without status migrainosus: Secondary | ICD-10-CM

## 2018-04-21 DIAGNOSIS — I82511 Chronic embolism and thrombosis of right femoral vein: Secondary | ICD-10-CM

## 2018-04-21 DIAGNOSIS — F419 Anxiety disorder, unspecified: Secondary | ICD-10-CM

## 2018-04-21 DIAGNOSIS — M818 Other osteoporosis without current pathological fracture: Secondary | ICD-10-CM

## 2018-04-21 DIAGNOSIS — M25552 Pain in left hip: Secondary | ICD-10-CM

## 2018-04-21 DIAGNOSIS — J45909 Unspecified asthma, uncomplicated: Secondary | ICD-10-CM

## 2018-04-21 DIAGNOSIS — Z79899 Other long term (current) drug therapy: Secondary | ICD-10-CM

## 2018-04-21 DIAGNOSIS — E538 Deficiency of other specified B group vitamins: Secondary | ICD-10-CM

## 2018-04-21 DIAGNOSIS — Z7982 Long term (current) use of aspirin: Secondary | ICD-10-CM

## 2018-04-21 DIAGNOSIS — M2578 Osteophyte, vertebrae: Secondary | ICD-10-CM

## 2018-04-21 DIAGNOSIS — Z885 Allergy status to narcotic agent status: Secondary | ICD-10-CM

## 2018-04-21 DIAGNOSIS — J449 Chronic obstructive pulmonary disease, unspecified: Secondary | ICD-10-CM

## 2018-04-21 DIAGNOSIS — Z86718 Personal history of other venous thrombosis and embolism: Secondary | ICD-10-CM

## 2018-04-21 DIAGNOSIS — Z888 Allergy status to other drugs, medicaments and biological substances status: Secondary | ICD-10-CM

## 2018-04-21 DIAGNOSIS — I82409 Acute embolism and thrombosis of unspecified deep veins of unspecified lower extremity: Principal | ICD-10-CM

## 2018-04-21 DIAGNOSIS — M8700 Idiopathic aseptic necrosis of unspecified bone: Secondary | ICD-10-CM

## 2018-04-21 MED ORDER — ROCURONIUM BROMIDE 50 MG/5ML IV SOSY
Status: DC | PRN
Start: 2018-04-21 — End: 2018-04-21

## 2018-04-21 MED ORDER — PANTOPRAZOLE SODIUM 40 MG PO TBEC
40 mg | Freq: Every morning | ORAL | Status: DC
Start: 2018-04-21 — End: 2018-04-23

## 2018-04-21 MED ORDER — SUGAMMADEX SODIUM 200 MG/2ML IV SOLN
Status: DC | PRN
Start: 2018-04-21 — End: 2018-04-21

## 2018-04-21 MED ORDER — HALOPERIDOL LACTATE 5 MG/ML IJ SOLN
.5 mg | INTRAVENOUS | Status: CP | PRN
Start: 2018-04-21 — End: ?

## 2018-04-21 MED ORDER — CLOPIDOGREL BISULFATE 75 MG PO TABS
75 mg | Freq: Every day | ORAL | Status: DC
Start: 2018-04-21 — End: 2018-04-23

## 2018-04-21 MED ORDER — LABETALOL HCL 5 MG/ML IV SOLN SH
10 mg | INTRAVENOUS | Status: CP | PRN
Start: 2018-04-21 — End: ?

## 2018-04-21 MED ORDER — ONDANSETRON HCL 4 MG/2ML IJ SOLN
Status: DC | PRN
Start: 2018-04-21 — End: 2018-04-21

## 2018-04-21 MED ORDER — LIDOCAINE HCL (PF) 2 % IJ SOLN SH
Freq: Once | SUBCUTANEOUS | Status: CP
Start: 2018-04-21 — End: ?

## 2018-04-21 MED ORDER — GLUCOSE 4 G PO CHEW JX
16 g | ORAL | Status: DC | PRN
Start: 2018-04-21 — End: 2018-04-23

## 2018-04-21 MED ORDER — NALOXONE HCL 2 MG/2ML IJ SOSY
.4 mg | INTRAVENOUS | Status: CP | PRN
Start: 2018-04-21 — End: ?

## 2018-04-21 MED ORDER — LIDOCAINE HCL (PF) 1 % IJ SOLN SH
INTRAVENOUS | Status: DC | PRN
Start: 2018-04-21 — End: 2018-04-21

## 2018-04-21 MED ORDER — MIDAZOLAM HCL 2 MG/2ML IJ SOLN
Status: DC | PRN
Start: 2018-04-21 — End: 2018-04-21

## 2018-04-21 MED ORDER — HEPARIN (PORCINE) IN NACL 2000-0.9 UNIT/L-% IV SOLN
INTRA_ARTERIAL | Status: CP | PRN
Start: 2018-04-21 — End: ?

## 2018-04-21 MED ORDER — IPRATROPIUM-ALBUTEROL 0.5-2.5 (3) MG/3ML IN SOLN
3 mL | Freq: Once | RESPIRATORY_TRACT | Status: CP
Start: 2018-04-21 — End: ?

## 2018-04-21 MED ORDER — IOHEXOL 300 MG/ML IJ SOLN
150 mL | Freq: Once | INTRA_ARTERIAL | Status: CP
Start: 2018-04-21 — End: ?

## 2018-04-21 MED ORDER — HYDRALAZINE HCL 20 MG/ML IJ SOLN
5 mg | INTRAVENOUS | Status: CP | PRN
Start: 2018-04-21 — End: ?

## 2018-04-21 MED ORDER — ASPIRIN 325 MG PO TABS
325 mg | Freq: Every day | ORAL | Status: DC
Start: 2018-04-21 — End: 2018-04-23

## 2018-04-21 MED ORDER — FENTANYL CITRATE INJ 50 MCG/ML CUSTOM AMP/VIAL
Status: DC | PRN
Start: 2018-04-21 — End: 2018-04-21

## 2018-04-21 MED ORDER — PROMETHAZINE HCL 25 MG/ML IJ SOLN
12.5 mg | Freq: Four times a day (QID) | INTRAVENOUS | Status: DC | PRN
Start: 2018-04-21 — End: 2018-04-23

## 2018-04-21 MED ORDER — LACTATED RINGERS IV SOLN
Status: DC | PRN
Start: 2018-04-21 — End: 2018-04-21

## 2018-04-21 MED ORDER — PROPOFOL 10 MG/ML IV EMUL CUSTOM SH
Status: DC | PRN
Start: 2018-04-21 — End: 2018-04-21

## 2018-04-21 MED ORDER — ORAL CARBOHYDRATE FOR HYPOGLYCEMIA JX
4-8 [oz_av] | ORAL | Status: DC | PRN
Start: 2018-04-21 — End: 2018-04-23

## 2018-04-21 MED ORDER — OXYCODONE-ACETAMINOPHEN 5-325 MG PO TABS
5 mg | ORAL | Status: DC | PRN
Start: 2018-04-21 — End: 2018-04-23

## 2018-04-21 MED ORDER — PROMETHAZINE HCL 25 MG/ML IJ SOLN
25 mg | Freq: Four times a day (QID) | INTRAMUSCULAR | Status: DC | PRN
Start: 2018-04-21 — End: 2018-04-22

## 2018-04-21 MED ORDER — DEXTROSE 50 % IV SOLN
30 mL | INTRAVENOUS | Status: DC | PRN
Start: 2018-04-21 — End: 2018-04-23

## 2018-04-21 MED ORDER — PROMETHAZINE HCL 25 MG/ML IJ SOLN
6.25 mg | INTRAVENOUS | Status: CP | PRN
Start: 2018-04-21 — End: ?

## 2018-04-21 MED ORDER — MUPIROCIN 2 % EX OINT
Freq: Two times a day (BID) | NASAL | Status: DC
Start: 2018-04-21 — End: 2018-04-23

## 2018-04-21 MED ORDER — HEPARIN SODIUM (PORCINE) 1000 UNIT/ML IJ SOLN
Status: DC | PRN
Start: 2018-04-21 — End: 2018-04-21

## 2018-04-21 MED ORDER — SODIUM CHLORIDE 0.9% FOR FLUSHES
20-180 mL | Status: CP | PRN
Start: 2018-04-21 — End: ?

## 2018-04-22 DIAGNOSIS — F419 Anxiety disorder, unspecified: Secondary | ICD-10-CM

## 2018-04-22 DIAGNOSIS — Z96642 Presence of left artificial hip joint: Secondary | ICD-10-CM

## 2018-04-22 DIAGNOSIS — Z7982 Long term (current) use of aspirin: Secondary | ICD-10-CM

## 2018-04-22 DIAGNOSIS — F329 Major depressive disorder, single episode, unspecified: Secondary | ICD-10-CM

## 2018-04-22 DIAGNOSIS — M2578 Osteophyte, vertebrae: Secondary | ICD-10-CM

## 2018-04-22 DIAGNOSIS — G43909 Migraine, unspecified, not intractable, without status migrainosus: Secondary | ICD-10-CM

## 2018-04-22 DIAGNOSIS — Z885 Allergy status to narcotic agent status: Secondary | ICD-10-CM

## 2018-04-22 DIAGNOSIS — Z86718 Personal history of other venous thrombosis and embolism: Secondary | ICD-10-CM

## 2018-04-22 DIAGNOSIS — I728 Aneurysm of other specified arteries: Principal | ICD-10-CM

## 2018-04-22 DIAGNOSIS — M818 Other osteoporosis without current pathological fracture: Secondary | ICD-10-CM

## 2018-04-22 DIAGNOSIS — E538 Deficiency of other specified B group vitamins: Secondary | ICD-10-CM

## 2018-04-22 DIAGNOSIS — G4733 Obstructive sleep apnea (adult) (pediatric): Secondary | ICD-10-CM

## 2018-04-22 DIAGNOSIS — D6852 Prothrombin gene mutation: Secondary | ICD-10-CM

## 2018-04-22 DIAGNOSIS — M8788 Other osteonecrosis, other site: Secondary | ICD-10-CM

## 2018-04-22 DIAGNOSIS — M25552 Pain in left hip: Secondary | ICD-10-CM

## 2018-04-22 DIAGNOSIS — I671 Cerebral aneurysm, nonruptured: Secondary | ICD-10-CM

## 2018-04-22 DIAGNOSIS — Z880 Allergy status to penicillin: Secondary | ICD-10-CM

## 2018-04-22 DIAGNOSIS — Z888 Allergy status to other drugs, medicaments and biological substances status: Secondary | ICD-10-CM

## 2018-04-22 DIAGNOSIS — Z7902 Long term (current) use of antithrombotics/antiplatelets: Secondary | ICD-10-CM

## 2018-04-22 DIAGNOSIS — Z79899 Other long term (current) drug therapy: Secondary | ICD-10-CM

## 2018-04-22 DIAGNOSIS — J449 Chronic obstructive pulmonary disease, unspecified: Secondary | ICD-10-CM

## 2018-04-22 DIAGNOSIS — R079 Chest pain, unspecified: Secondary | ICD-10-CM

## 2018-04-22 MED ORDER — HYDROMORPHONE HCL 1 MG/ML IJ SOLN
.5 mg | Freq: Once | INTRAVENOUS | Status: CP
Start: 2018-04-22 — End: ?

## 2018-04-25 DIAGNOSIS — J019 Acute sinusitis, unspecified: Principal | ICD-10-CM

## 2018-04-25 DIAGNOSIS — A6000 Herpesviral infection of urogenital system, unspecified: Secondary | ICD-10-CM

## 2018-04-25 DIAGNOSIS — K589 Irritable bowel syndrome without diarrhea: Principal | ICD-10-CM

## 2018-04-27 ENCOUNTER — Inpatient Hospital Stay: Admit: 2018-04-27 | Discharge: 2018-04-28 | Primary: Family Medicine

## 2018-04-27 DIAGNOSIS — J320 Chronic maxillary sinusitis: Principal | ICD-10-CM

## 2018-04-27 DIAGNOSIS — H0589 Other disorders of orbit: Secondary | ICD-10-CM

## 2018-04-27 DIAGNOSIS — I671 Cerebral aneurysm, nonruptured: Secondary | ICD-10-CM

## 2018-04-27 DIAGNOSIS — J3489 Other specified disorders of nose and nasal sinuses: Secondary | ICD-10-CM

## 2018-04-27 DIAGNOSIS — H47321 Drusen of optic disc, right eye: Secondary | ICD-10-CM

## 2018-04-27 DIAGNOSIS — H0530 Unspecified deformity of orbit: Secondary | ICD-10-CM

## 2018-04-27 MED ORDER — VALACYCLOVIR HCL 500 MG PO TABS
500 mg | Freq: Three times a day (TID) | ORAL | 2 refills | 5.00 days | Status: CP
Start: 2018-04-27 — End: 2018-05-04

## 2018-04-27 MED ORDER — PSYLLIUM 51.7 % PO POWD
11 refills
Start: 2018-04-27 — End: ?

## 2018-04-27 MED ORDER — AZITHROMYCIN 250 MG PO TABS
ORAL | 0 refills | 3.00000 days | Status: CP
Start: 2018-04-27 — End: 2018-05-04

## 2018-05-01 DIAGNOSIS — K589 Irritable bowel syndrome without diarrhea: Principal | ICD-10-CM

## 2018-05-01 DIAGNOSIS — R42 Dizziness and giddiness: Principal | ICD-10-CM

## 2018-05-01 DIAGNOSIS — J019 Acute sinusitis, unspecified: Principal | ICD-10-CM

## 2018-05-01 DIAGNOSIS — A6000 Herpesviral infection of urogenital system, unspecified: Secondary | ICD-10-CM

## 2018-05-01 DIAGNOSIS — F329 Major depressive disorder, single episode, unspecified: Secondary | ICD-10-CM

## 2018-05-01 DIAGNOSIS — J449 Chronic obstructive pulmonary disease, unspecified: Principal | ICD-10-CM

## 2018-05-04 MED ORDER — PROVENTIL HFA 108 (90 BASE) MCG/ACT IN AERS
2 | RESPIRATORY_TRACT | 11 refills | 30.00000 days | Status: CP | PRN
Start: 2018-05-04 — End: 2018-05-25

## 2018-05-04 MED ORDER — VALACYCLOVIR HCL 500 MG PO TABS
500 mg | Freq: Three times a day (TID) | ORAL | 0 refills | 5.00 days | Status: CP
Start: 2018-05-04 — End: ?

## 2018-05-04 MED ORDER — PSYLLIUM 51.7 % PO POWD
11 refills
Start: 2018-05-04 — End: ?

## 2018-05-04 MED ORDER — SERTRALINE HCL 100 MG PO TABS
200 mg | Freq: Every day | ORAL | 3 refills | Status: CP
Start: 2018-05-04 — End: ?

## 2018-05-04 MED ORDER — AZITHROMYCIN 250 MG PO TABS
ORAL | 0 refills | 3.00000 days | Status: CP
Start: 2018-05-04 — End: ?

## 2018-05-04 MED ORDER — CYANOCOBALAMIN 1000 MCG PO TABS
1000 ug | Freq: Every day | ORAL | 2 refills | Status: CP
Start: 2018-05-04 — End: ?

## 2018-05-15 ENCOUNTER — Encounter: Attending: Otolaryngology | Primary: Family Medicine

## 2018-05-19 ENCOUNTER — Encounter: Primary: Family Medicine

## 2018-05-25 DIAGNOSIS — J449 Chronic obstructive pulmonary disease, unspecified: Principal | ICD-10-CM

## 2018-05-25 MED ORDER — PROVENTIL HFA 108 (90 BASE) MCG/ACT IN AERS
2 | RESPIRATORY_TRACT | 11 refills | 30.00000 days | Status: CP | PRN
Start: 2018-05-25 — End: 2018-06-10

## 2018-06-10 ENCOUNTER — Telehealth: Admit: 2018-06-10 | Discharge: 2018-06-10 | Attending: Neurological Surgery | Primary: Family Medicine

## 2018-06-10 DIAGNOSIS — I671 Cerebral aneurysm, nonruptured: Principal | ICD-10-CM

## 2018-06-10 MED ORDER — ASPIRIN 325 MG PO TABS
325 mg | Freq: Every day | ORAL | 3 refills | Status: CP
Start: 2018-06-10 — End: ?

## 2018-06-10 MED ORDER — PROVENTIL HFA 108 (90 BASE) MCG/ACT IN AERS
2 | RESPIRATORY_TRACT | 3 refills | Status: CP | PRN
Start: 2018-06-10 — End: ?

## 2018-06-10 MED ORDER — CLOPIDOGREL BISULFATE 75 MG PO TABS
75 mg | Freq: Every day | ORAL | 3 refills | Status: CP
Start: 2018-06-10 — End: ?

## 2018-06-12 ENCOUNTER — Telehealth: Attending: Otolaryngology | Primary: Family Medicine

## 2018-06-12 ENCOUNTER — Telehealth: Admit: 2018-06-12 | Discharge: 2018-06-12 | Attending: Internal Medicine | Primary: Family Medicine

## 2018-06-12 DIAGNOSIS — J45909 Unspecified asthma, uncomplicated: Secondary | ICD-10-CM

## 2018-06-12 DIAGNOSIS — F329 Major depressive disorder, single episode, unspecified: Secondary | ICD-10-CM

## 2018-06-12 DIAGNOSIS — D841 Defects in the complement system: Secondary | ICD-10-CM

## 2018-06-12 DIAGNOSIS — K090 Developmental odontogenic cysts: Secondary | ICD-10-CM

## 2018-06-12 DIAGNOSIS — I82409 Acute embolism and thrombosis of unspecified deep veins of unspecified lower extremity: Principal | ICD-10-CM

## 2018-06-12 DIAGNOSIS — J343 Hypertrophy of nasal turbinates: Principal | ICD-10-CM

## 2018-06-12 DIAGNOSIS — M87052 Idiopathic aseptic necrosis of left femur: Secondary | ICD-10-CM

## 2018-06-12 DIAGNOSIS — Z7951 Long term (current) use of inhaled steroids: Secondary | ICD-10-CM

## 2018-06-12 DIAGNOSIS — F1290 Cannabis use, unspecified, uncomplicated: Secondary | ICD-10-CM

## 2018-06-12 DIAGNOSIS — D6852 Prothrombin gene mutation: Principal | ICD-10-CM

## 2018-06-12 DIAGNOSIS — M8700 Idiopathic aseptic necrosis of unspecified bone: Secondary | ICD-10-CM

## 2018-06-12 DIAGNOSIS — D6859 Other primary thrombophilia: Secondary | ICD-10-CM

## 2018-06-12 DIAGNOSIS — R0982 Postnasal drip: Secondary | ICD-10-CM

## 2018-06-12 DIAGNOSIS — S0230XA Fracture of orbital floor, unspecified side, initial encounter for closed fracture: Secondary | ICD-10-CM

## 2018-06-12 DIAGNOSIS — Z86718 Personal history of other venous thrombosis and embolism: Secondary | ICD-10-CM

## 2018-06-12 DIAGNOSIS — Z7982 Long term (current) use of aspirin: Secondary | ICD-10-CM

## 2018-06-12 DIAGNOSIS — R51 Headache: Secondary | ICD-10-CM

## 2018-06-12 DIAGNOSIS — F410 Panic disorder [episodic paroxysmal anxiety] without agoraphobia: Secondary | ICD-10-CM

## 2018-06-12 DIAGNOSIS — Z79899 Other long term (current) drug therapy: Secondary | ICD-10-CM

## 2018-06-12 DIAGNOSIS — F419 Anxiety disorder, unspecified: Secondary | ICD-10-CM

## 2018-06-12 DIAGNOSIS — G4733 Obstructive sleep apnea (adult) (pediatric): Secondary | ICD-10-CM

## 2018-06-12 MED ORDER — FLUTICASONE PROPIONATE 50 MCG/ACT NA SUSP
2 | Freq: Every day | NASAL | 3 refills | Status: CP
Start: 2018-06-12 — End: ?

## 2018-06-15 ENCOUNTER — Encounter: Attending: Otolaryngology | Primary: Family Medicine

## 2018-06-25 DIAGNOSIS — I671 Cerebral aneurysm, nonruptured: Principal | ICD-10-CM

## 2018-06-26 ENCOUNTER — Inpatient Hospital Stay: Attending: Family Medicine | Primary: Family Medicine

## 2018-06-29 ENCOUNTER — Encounter: Attending: Physician Assistant | Primary: Family Medicine

## 2018-07-15 ENCOUNTER — Encounter: Attending: Oral & Maxillofacial Surgery | Primary: Family Medicine

## 2018-07-21 ENCOUNTER — Encounter: Attending: Family Medicine | Primary: Family Medicine

## 2018-08-13 ENCOUNTER — Encounter: Attending: Ophthalmology | Primary: Family Medicine

## 2018-09-15 DIAGNOSIS — I671 Cerebral aneurysm, nonruptured: Principal | ICD-10-CM

## 2018-09-25 ENCOUNTER — Encounter: Attending: Ophthalmology | Primary: Family Medicine

## 2018-10-20 ENCOUNTER — Inpatient Hospital Stay: Admit: 2018-10-20 | Discharge: 2018-10-21 | Primary: Family Medicine

## 2018-10-20 DIAGNOSIS — D841 Defects in the complement system: Secondary | ICD-10-CM

## 2018-10-20 DIAGNOSIS — I82409 Acute embolism and thrombosis of unspecified deep veins of unspecified lower extremity: Principal | ICD-10-CM

## 2018-10-20 DIAGNOSIS — F419 Anxiety disorder, unspecified: Secondary | ICD-10-CM

## 2018-10-20 DIAGNOSIS — M8700 Idiopathic aseptic necrosis of unspecified bone: Secondary | ICD-10-CM

## 2018-10-20 DIAGNOSIS — I671 Cerebral aneurysm, nonruptured: Principal | ICD-10-CM

## 2018-10-20 DIAGNOSIS — F329 Major depressive disorder, single episode, unspecified: Secondary | ICD-10-CM

## 2018-10-20 DIAGNOSIS — J45909 Unspecified asthma, uncomplicated: Secondary | ICD-10-CM

## 2018-10-20 DIAGNOSIS — G4733 Obstructive sleep apnea (adult) (pediatric): Secondary | ICD-10-CM

## 2018-10-20 MED ORDER — LIDOCAINE HCL (PF) 2 % IJ SOLN SH
Freq: Once | SUBCUTANEOUS | Status: CP
Start: 2018-10-20 — End: ?

## 2018-10-20 MED ORDER — CLINDAMYCIN PHOSPHATE IN D5W 900 MG/50ML IV SOLN
900 mg | Freq: Once | INTRAVENOUS | Status: CP
Start: 2018-10-20 — End: ?

## 2018-10-20 MED ORDER — IOHEXOL 300 MG/ML IJ SOLN
200 mL | Freq: Once | INTRA_ARTERIAL | Status: CP
Start: 2018-10-20 — End: ?

## 2018-10-20 MED ORDER — MIDAZOLAM HCL 2 MG/2ML IJ SOLN
.5-1 mg | INTRAVENOUS | Status: CP | PRN
Start: 2018-10-20 — End: ?

## 2018-10-20 MED ORDER — NALOXONE HCL 2 MG/2ML IJ SOSY
.2 mg | INTRAVENOUS | Status: CP | PRN
Start: 2018-10-20 — End: ?

## 2018-10-20 MED ORDER — HEPARIN (PORCINE) IN NACL 2000-0.9 UNIT/L-% IV SOLN
INTRA_ARTERIAL | Status: CP | PRN
Start: 2018-10-20 — End: ?

## 2018-10-20 MED ORDER — FENTANYL CITRATE INJ 50 MCG/ML CUSTOM AMP/VIAL
25-50 ug | INTRAVENOUS | Status: CP | PRN
Start: 2018-10-20 — End: ?

## 2018-10-20 MED ORDER — SODIUM CHLORIDE 0.9% FOR FLUSHES
20-180 mL | Status: CP | PRN
Start: 2018-10-20 — End: ?

## 2018-10-20 MED ORDER — ONDANSETRON HCL 4 MG/2ML IJ SOLN
4 mg | Freq: Once | INTRAVENOUS | Status: CP | PRN
Start: 2018-10-20 — End: ?

## 2018-12-08 DIAGNOSIS — J449 Chronic obstructive pulmonary disease, unspecified: Principal | ICD-10-CM

## 2018-12-08 MED ORDER — PROVENTIL HFA 108 (90 BASE) MCG/ACT IN AERS
RESPIRATORY_TRACT | 0 refills | 25.00000 days | Status: CP
Start: 2018-12-08 — End: ?

## 2018-12-25 ENCOUNTER — Encounter: Attending: Primary Care | Primary: Family Medicine

## 2018-12-30 DIAGNOSIS — J4540 Moderate persistent asthma, uncomplicated: Principal | ICD-10-CM

## 2018-12-30 DIAGNOSIS — J302 Other seasonal allergic rhinitis: Secondary | ICD-10-CM

## 2019-01-07 ENCOUNTER — Ambulatory Visit: Primary: Family Medicine

## 2019-01-08 ENCOUNTER — Encounter: Primary: Family Medicine

## 2019-01-14 ENCOUNTER — Ambulatory Visit: Primary: Family Medicine

## 2019-01-19 ENCOUNTER — Encounter: Attending: Primary Care | Primary: Family Medicine

## 2019-01-26 ENCOUNTER — Ambulatory Visit: Primary: Family Medicine

## 2019-02-01 ENCOUNTER — Ambulatory Visit: Payer: Medicaid Other | Primary: Family Medicine

## 2019-02-10 ENCOUNTER — Ambulatory Visit: Admit: 2019-02-10 | Payer: Medicaid Other | Primary: Family Medicine

## 2019-03-10 ENCOUNTER — Inpatient Hospital Stay: Admit: 2019-03-10 | Discharge: 2019-03-11 | Payer: Medicaid Other | Primary: Family Medicine

## 2019-03-10 DIAGNOSIS — Z87891 Personal history of nicotine dependence: Principal | ICD-10-CM

## 2019-03-10 DIAGNOSIS — J4540 Moderate persistent asthma, uncomplicated: Secondary | ICD-10-CM

## 2019-03-18 ENCOUNTER — Encounter: Payer: Medicaid Other | Attending: Family Medicine | Primary: Family Medicine

## 2019-03-26 ENCOUNTER — Telehealth: Payer: Medicaid Other | Attending: Family Medicine | Primary: Family Medicine

## 2019-03-26 ENCOUNTER — Encounter: Payer: Medicaid Other | Attending: Family Medicine | Primary: Family Medicine

## 2019-03-26 DIAGNOSIS — F419 Anxiety disorder, unspecified: Secondary | ICD-10-CM

## 2019-03-26 DIAGNOSIS — D689 Coagulation defect, unspecified: Secondary | ICD-10-CM

## 2019-03-26 DIAGNOSIS — D682 Hereditary deficiency of other clotting factors: Secondary | ICD-10-CM

## 2019-03-26 DIAGNOSIS — K219 Gastro-esophageal reflux disease without esophagitis: Secondary | ICD-10-CM

## 2019-03-26 DIAGNOSIS — F411 Generalized anxiety disorder: Secondary | ICD-10-CM

## 2019-03-26 DIAGNOSIS — F1910 Other psychoactive substance abuse, uncomplicated: Secondary | ICD-10-CM

## 2019-03-26 DIAGNOSIS — J45909 Unspecified asthma, uncomplicated: Secondary | ICD-10-CM

## 2019-03-26 DIAGNOSIS — G4733 Obstructive sleep apnea (adult) (pediatric): Secondary | ICD-10-CM

## 2019-03-26 DIAGNOSIS — M1990 Unspecified osteoarthritis, unspecified site: Secondary | ICD-10-CM

## 2019-03-26 DIAGNOSIS — Z9109 Other allergy status, other than to drugs and biological substances: Secondary | ICD-10-CM

## 2019-03-26 DIAGNOSIS — J449 Chronic obstructive pulmonary disease, unspecified: Secondary | ICD-10-CM

## 2019-03-26 DIAGNOSIS — Z1329 Encounter for screening for other suspected endocrine disorder: Secondary | ICD-10-CM

## 2019-03-26 DIAGNOSIS — I671 Cerebral aneurysm, nonruptured: Secondary | ICD-10-CM

## 2019-03-26 DIAGNOSIS — M25552 Pain in left hip: Secondary | ICD-10-CM

## 2019-03-26 DIAGNOSIS — Z114 Encounter for screening for human immunodeficiency virus [HIV]: Secondary | ICD-10-CM

## 2019-03-26 DIAGNOSIS — Z1159 Encounter for screening for other viral diseases: Secondary | ICD-10-CM

## 2019-03-26 DIAGNOSIS — Z889 Allergy status to unspecified drugs, medicaments and biological substances status: Secondary | ICD-10-CM

## 2019-03-26 DIAGNOSIS — Z1231 Encounter for screening mammogram for malignant neoplasm of breast: Principal | ICD-10-CM

## 2019-03-26 DIAGNOSIS — M8700 Idiopathic aseptic necrosis of unspecified bone: Secondary | ICD-10-CM

## 2019-03-26 DIAGNOSIS — D841 Defects in the complement system: Secondary | ICD-10-CM

## 2019-03-26 DIAGNOSIS — R519 Headache: Secondary | ICD-10-CM

## 2019-03-26 DIAGNOSIS — F329 Major depressive disorder, single episode, unspecified: Secondary | ICD-10-CM

## 2019-03-26 DIAGNOSIS — Z13220 Encounter for screening for lipoid disorders: Secondary | ICD-10-CM

## 2019-03-26 DIAGNOSIS — Z96642 Presence of left artificial hip joint: Secondary | ICD-10-CM

## 2019-03-26 DIAGNOSIS — I82409 Acute embolism and thrombosis of unspecified deep veins of unspecified lower extremity: Principal | ICD-10-CM

## 2019-03-26 MED ORDER — DESVENLAFAXINE SUCCINATE ER 100 MG PO TB24: Start: 2019-03-26 — End: ?

## 2019-03-31 ENCOUNTER — Inpatient Hospital Stay: Admit: 2019-03-31 | Discharge: 2019-04-01 | Payer: Medicaid Other | Primary: Family Medicine

## 2019-03-31 ENCOUNTER — Encounter: Primary: Family Medicine

## 2019-03-31 DIAGNOSIS — M79675 Pain in left toe(s): Secondary | ICD-10-CM

## 2019-03-31 DIAGNOSIS — M7989 Other specified soft tissue disorders: Secondary | ICD-10-CM

## 2019-03-31 DIAGNOSIS — M25552 Pain in left hip: Secondary | ICD-10-CM

## 2019-03-31 DIAGNOSIS — Z96642 Presence of left artificial hip joint: Secondary | ICD-10-CM

## 2019-03-31 DIAGNOSIS — S99922A Unspecified injury of left foot, initial encounter: Principal | ICD-10-CM

## 2019-04-02 ENCOUNTER — Ambulatory Visit: Attending: Internal Medicine | Primary: Family Medicine

## 2019-04-02 ENCOUNTER — Ambulatory Visit: Payer: Medicaid Other | Attending: Family Medicine | Primary: Family Medicine

## 2019-04-02 DIAGNOSIS — S92919A Unspecified fracture of unspecified toe(s), initial encounter for closed fracture: Secondary | ICD-10-CM

## 2019-04-02 DIAGNOSIS — R0602 Shortness of breath: Principal | ICD-10-CM

## 2019-04-02 DIAGNOSIS — R0789 Other chest pain: Secondary | ICD-10-CM

## 2019-04-03 DIAGNOSIS — R519 Headache: Secondary | ICD-10-CM

## 2019-04-03 DIAGNOSIS — F419 Anxiety disorder, unspecified: Secondary | ICD-10-CM

## 2019-04-03 DIAGNOSIS — Z9109 Other allergy status, other than to drugs and biological substances: Secondary | ICD-10-CM

## 2019-04-03 DIAGNOSIS — D689 Coagulation defect, unspecified: Secondary | ICD-10-CM

## 2019-04-03 DIAGNOSIS — F329 Major depressive disorder, single episode, unspecified: Secondary | ICD-10-CM

## 2019-04-03 DIAGNOSIS — J449 Chronic obstructive pulmonary disease, unspecified: Secondary | ICD-10-CM

## 2019-04-03 DIAGNOSIS — K219 Gastro-esophageal reflux disease without esophagitis: Secondary | ICD-10-CM

## 2019-04-03 DIAGNOSIS — F1910 Other psychoactive substance abuse, uncomplicated: Secondary | ICD-10-CM

## 2019-04-03 DIAGNOSIS — J45909 Unspecified asthma, uncomplicated: Secondary | ICD-10-CM

## 2019-04-03 DIAGNOSIS — M8700 Idiopathic aseptic necrosis of unspecified bone: Secondary | ICD-10-CM

## 2019-04-03 DIAGNOSIS — Z889 Allergy status to unspecified drugs, medicaments and biological substances status: Secondary | ICD-10-CM

## 2019-04-03 DIAGNOSIS — M1990 Unspecified osteoarthritis, unspecified site: Secondary | ICD-10-CM

## 2019-04-03 DIAGNOSIS — D841 Defects in the complement system: Secondary | ICD-10-CM

## 2019-04-03 DIAGNOSIS — G4733 Obstructive sleep apnea (adult) (pediatric): Secondary | ICD-10-CM

## 2019-04-03 DIAGNOSIS — I82409 Acute embolism and thrombosis of unspecified deep veins of unspecified lower extremity: Principal | ICD-10-CM

## 2019-04-05 DIAGNOSIS — J302 Other seasonal allergic rhinitis: Principal | ICD-10-CM

## 2019-04-05 DIAGNOSIS — J4540 Moderate persistent asthma, uncomplicated: Secondary | ICD-10-CM

## 2019-04-05 MED ORDER — BUDESONIDE-FORMOTEROL FUMARATE 160-4.5 MCG/ACT IN AERO
2 | Freq: Two times a day (BID) | RESPIRATORY_TRACT | 11 refills | Status: CP
Start: 2019-04-05 — End: ?

## 2019-04-07 ENCOUNTER — Inpatient Hospital Stay: Admit: 2019-04-07 | Discharge: 2019-04-08 | Payer: Medicaid Other | Primary: Family Medicine

## 2019-04-07 ENCOUNTER — Ambulatory Visit
Admit: 2019-04-07 | Discharge: 2019-04-08 | Payer: Medicaid Other | Attending: Orthopaedic Surgery | Primary: Family Medicine

## 2019-04-07 DIAGNOSIS — M25552 Pain in left hip: Secondary | ICD-10-CM

## 2019-04-07 DIAGNOSIS — Z1231 Encounter for screening mammogram for malignant neoplasm of breast: Principal | ICD-10-CM

## 2019-04-07 DIAGNOSIS — Z96642 Presence of left artificial hip joint: Principal | ICD-10-CM

## 2019-04-07 MED ORDER — SERTRALINE HCL 100 MG PO TABS
100 mg | Freq: Every day | ORAL
Start: 2019-04-07 — End: ?

## 2019-04-13 ENCOUNTER — Ambulatory Visit
Admit: 2019-04-13 | Discharge: 2019-04-14 | Payer: Medicaid Other | Attending: Student in an Organized Health Care Education/Training Program | Primary: Family Medicine

## 2019-04-13 DIAGNOSIS — D689 Coagulation defect, unspecified: Secondary | ICD-10-CM

## 2019-04-13 DIAGNOSIS — M1990 Unspecified osteoarthritis, unspecified site: Secondary | ICD-10-CM

## 2019-04-13 DIAGNOSIS — S92919A Unspecified fracture of unspecified toe(s), initial encounter for closed fracture: Principal | ICD-10-CM

## 2019-04-13 DIAGNOSIS — R519 Headache: Secondary | ICD-10-CM

## 2019-04-13 DIAGNOSIS — F329 Major depressive disorder, single episode, unspecified: Secondary | ICD-10-CM

## 2019-04-13 DIAGNOSIS — F419 Anxiety disorder, unspecified: Secondary | ICD-10-CM

## 2019-04-13 DIAGNOSIS — F1910 Other psychoactive substance abuse, uncomplicated: Secondary | ICD-10-CM

## 2019-04-13 DIAGNOSIS — R600 Localized edema: Secondary | ICD-10-CM

## 2019-04-13 DIAGNOSIS — Z889 Allergy status to unspecified drugs, medicaments and biological substances status: Secondary | ICD-10-CM

## 2019-04-13 DIAGNOSIS — M8700 Idiopathic aseptic necrosis of unspecified bone: Secondary | ICD-10-CM

## 2019-04-13 DIAGNOSIS — D841 Defects in the complement system: Secondary | ICD-10-CM

## 2019-04-13 DIAGNOSIS — J449 Chronic obstructive pulmonary disease, unspecified: Secondary | ICD-10-CM

## 2019-04-13 DIAGNOSIS — G4733 Obstructive sleep apnea (adult) (pediatric): Secondary | ICD-10-CM

## 2019-04-13 DIAGNOSIS — J45909 Unspecified asthma, uncomplicated: Secondary | ICD-10-CM

## 2019-04-13 DIAGNOSIS — Z96642 Presence of left artificial hip joint: Secondary | ICD-10-CM

## 2019-04-13 DIAGNOSIS — Z9109 Other allergy status, other than to drugs and biological substances: Secondary | ICD-10-CM

## 2019-04-13 DIAGNOSIS — K219 Gastro-esophageal reflux disease without esophagitis: Secondary | ICD-10-CM

## 2019-04-13 DIAGNOSIS — I82409 Acute embolism and thrombosis of unspecified deep veins of unspecified lower extremity: Principal | ICD-10-CM

## 2019-04-21 ENCOUNTER — Ambulatory Visit
Payer: Medicaid Other | Attending: Student in an Organized Health Care Education/Training Program | Primary: Family Medicine

## 2019-04-21 DIAGNOSIS — D841 Defects in the complement system: Secondary | ICD-10-CM

## 2019-04-21 DIAGNOSIS — D689 Coagulation defect, unspecified: Secondary | ICD-10-CM

## 2019-04-21 DIAGNOSIS — K219 Gastro-esophageal reflux disease without esophagitis: Secondary | ICD-10-CM

## 2019-04-21 DIAGNOSIS — F1910 Other psychoactive substance abuse, uncomplicated: Secondary | ICD-10-CM

## 2019-04-21 DIAGNOSIS — Z889 Allergy status to unspecified drugs, medicaments and biological substances status: Secondary | ICD-10-CM

## 2019-04-21 DIAGNOSIS — F419 Anxiety disorder, unspecified: Secondary | ICD-10-CM

## 2019-04-21 DIAGNOSIS — Z9109 Other allergy status, other than to drugs and biological substances: Secondary | ICD-10-CM

## 2019-04-21 DIAGNOSIS — M8700 Idiopathic aseptic necrosis of unspecified bone: Secondary | ICD-10-CM

## 2019-04-21 DIAGNOSIS — K090 Developmental odontogenic cysts: Principal | ICD-10-CM

## 2019-04-21 DIAGNOSIS — I82409 Acute embolism and thrombosis of unspecified deep veins of unspecified lower extremity: Principal | ICD-10-CM

## 2019-04-21 DIAGNOSIS — G4733 Obstructive sleep apnea (adult) (pediatric): Secondary | ICD-10-CM

## 2019-04-21 DIAGNOSIS — M1990 Unspecified osteoarthritis, unspecified site: Secondary | ICD-10-CM

## 2019-04-21 DIAGNOSIS — J449 Chronic obstructive pulmonary disease, unspecified: Secondary | ICD-10-CM

## 2019-04-21 DIAGNOSIS — R519 Headache: Secondary | ICD-10-CM

## 2019-04-21 DIAGNOSIS — J45909 Unspecified asthma, uncomplicated: Secondary | ICD-10-CM

## 2019-04-21 DIAGNOSIS — F329 Major depressive disorder, single episode, unspecified: Secondary | ICD-10-CM

## 2019-04-25 DIAGNOSIS — F1910 Other psychoactive substance abuse, uncomplicated: Secondary | ICD-10-CM

## 2019-04-25 DIAGNOSIS — J45909 Unspecified asthma, uncomplicated: Secondary | ICD-10-CM

## 2019-04-25 DIAGNOSIS — G4733 Obstructive sleep apnea (adult) (pediatric): Secondary | ICD-10-CM

## 2019-04-25 DIAGNOSIS — Z9109 Other allergy status, other than to drugs and biological substances: Secondary | ICD-10-CM

## 2019-04-25 DIAGNOSIS — J449 Chronic obstructive pulmonary disease, unspecified: Secondary | ICD-10-CM

## 2019-04-25 DIAGNOSIS — M8700 Idiopathic aseptic necrosis of unspecified bone: Secondary | ICD-10-CM

## 2019-04-25 DIAGNOSIS — R519 Headache: Secondary | ICD-10-CM

## 2019-04-25 DIAGNOSIS — D841 Defects in the complement system: Secondary | ICD-10-CM

## 2019-04-25 DIAGNOSIS — Z889 Allergy status to unspecified drugs, medicaments and biological substances status: Secondary | ICD-10-CM

## 2019-04-25 DIAGNOSIS — F419 Anxiety disorder, unspecified: Secondary | ICD-10-CM

## 2019-04-25 DIAGNOSIS — K219 Gastro-esophageal reflux disease without esophagitis: Secondary | ICD-10-CM

## 2019-04-25 DIAGNOSIS — M1990 Unspecified osteoarthritis, unspecified site: Secondary | ICD-10-CM

## 2019-04-25 DIAGNOSIS — F329 Major depressive disorder, single episode, unspecified: Secondary | ICD-10-CM

## 2019-04-25 DIAGNOSIS — D689 Coagulation defect, unspecified: Secondary | ICD-10-CM

## 2019-04-25 DIAGNOSIS — I82409 Acute embolism and thrombosis of unspecified deep veins of unspecified lower extremity: Principal | ICD-10-CM

## 2019-05-11 ENCOUNTER — Encounter: Primary: Family Medicine

## 2019-05-22 ENCOUNTER — Ambulatory Visit: Admit: 2019-05-22 | Payer: Medicaid Other | Primary: Family Medicine

## 2019-05-22 DIAGNOSIS — Z23 Encounter for immunization: Principal | ICD-10-CM

## 2019-05-25 ENCOUNTER — Ambulatory Visit
Admit: 2019-05-25 | Discharge: 2019-05-26 | Payer: Medicaid Other | Attending: Sports Medicine | Primary: Family Medicine

## 2019-05-25 DIAGNOSIS — G4733 Obstructive sleep apnea (adult) (pediatric): Secondary | ICD-10-CM

## 2019-05-25 DIAGNOSIS — F1910 Other psychoactive substance abuse, uncomplicated: Secondary | ICD-10-CM

## 2019-05-25 DIAGNOSIS — D689 Coagulation defect, unspecified: Secondary | ICD-10-CM

## 2019-05-25 DIAGNOSIS — M8700 Idiopathic aseptic necrosis of unspecified bone: Secondary | ICD-10-CM

## 2019-05-25 DIAGNOSIS — Z9109 Other allergy status, other than to drugs and biological substances: Secondary | ICD-10-CM

## 2019-05-25 DIAGNOSIS — T849XXS Unspecified complication of internal orthopedic prosthetic device, implant and graft, sequela: Principal | ICD-10-CM

## 2019-05-25 DIAGNOSIS — Z889 Allergy status to unspecified drugs, medicaments and biological substances status: Secondary | ICD-10-CM

## 2019-05-25 DIAGNOSIS — I82409 Acute embolism and thrombosis of unspecified deep veins of unspecified lower extremity: Principal | ICD-10-CM

## 2019-05-25 DIAGNOSIS — M1990 Unspecified osteoarthritis, unspecified site: Secondary | ICD-10-CM

## 2019-05-25 DIAGNOSIS — F419 Anxiety disorder, unspecified: Secondary | ICD-10-CM

## 2019-05-25 DIAGNOSIS — R519 Headache: Secondary | ICD-10-CM

## 2019-05-25 DIAGNOSIS — D841 Defects in the complement system: Secondary | ICD-10-CM

## 2019-05-25 DIAGNOSIS — Z96642 Presence of left artificial hip joint: Secondary | ICD-10-CM

## 2019-05-25 DIAGNOSIS — J45909 Unspecified asthma, uncomplicated: Secondary | ICD-10-CM

## 2019-05-25 DIAGNOSIS — J449 Chronic obstructive pulmonary disease, unspecified: Secondary | ICD-10-CM

## 2019-05-25 DIAGNOSIS — F329 Major depressive disorder, single episode, unspecified: Secondary | ICD-10-CM

## 2019-05-25 DIAGNOSIS — K219 Gastro-esophageal reflux disease without esophagitis: Secondary | ICD-10-CM

## 2019-05-25 MED ORDER — SODIUM CHLORIDE 0.9 % IV SOLN
1000 mL | INTRAVENOUS
Start: 2019-05-25 — End: ?

## 2019-05-25 MED ORDER — GABAPENTIN 300 MG PO CAPS
600 mg | Freq: Once | ORAL
Start: 2019-05-25 — End: ?

## 2019-05-25 MED ORDER — VANCOMYCIN HCL 1000 MG IV SOLR MBP/V2B KIT JX
1000 mg | Freq: Once | INTRAVENOUS
Start: 2019-05-25 — End: ?

## 2019-05-25 MED ORDER — CEFAZOLIN SODIUM 1 G IJ SOLR
2 g | Freq: Once | INTRAVENOUS
Start: 2019-05-25 — End: ?

## 2019-05-25 MED ORDER — CELECOXIB 200 MG PO CAPS
400 mg | Freq: Once | ORAL
Start: 2019-05-25 — End: ?

## 2019-05-25 MED ORDER — TOBRADEX 0.3-0.1 % OP SUSP: Start: 2019-05-25 — End: ?

## 2019-05-25 MED ORDER — ACETAMINOPHEN 500 MG PO TABS
1000 mg | Freq: Once | ORAL
Start: 2019-05-25 — End: ?

## 2019-05-27 ENCOUNTER — Ambulatory Visit
Admit: 2019-05-27 | Discharge: 2019-05-28 | Payer: Medicaid Other | Attending: Pulmonary Disease | Primary: Family Medicine

## 2019-05-27 DIAGNOSIS — J45909 Unspecified asthma, uncomplicated: Secondary | ICD-10-CM

## 2019-05-27 DIAGNOSIS — K219 Gastro-esophageal reflux disease without esophagitis: Secondary | ICD-10-CM

## 2019-05-27 DIAGNOSIS — M1990 Unspecified osteoarthritis, unspecified site: Secondary | ICD-10-CM

## 2019-05-27 DIAGNOSIS — D689 Coagulation defect, unspecified: Secondary | ICD-10-CM

## 2019-05-27 DIAGNOSIS — R0602 Shortness of breath: Principal | ICD-10-CM

## 2019-05-27 DIAGNOSIS — F419 Anxiety disorder, unspecified: Secondary | ICD-10-CM

## 2019-05-27 DIAGNOSIS — G4733 Obstructive sleep apnea (adult) (pediatric): Secondary | ICD-10-CM

## 2019-05-27 DIAGNOSIS — Z889 Allergy status to unspecified drugs, medicaments and biological substances status: Secondary | ICD-10-CM

## 2019-05-27 DIAGNOSIS — F329 Major depressive disorder, single episode, unspecified: Secondary | ICD-10-CM

## 2019-05-27 DIAGNOSIS — Z9109 Other allergy status, other than to drugs and biological substances: Secondary | ICD-10-CM

## 2019-05-27 DIAGNOSIS — F1910 Other psychoactive substance abuse, uncomplicated: Secondary | ICD-10-CM

## 2019-05-27 DIAGNOSIS — D841 Defects in the complement system: Secondary | ICD-10-CM

## 2019-05-27 DIAGNOSIS — M8700 Idiopathic aseptic necrosis of unspecified bone: Secondary | ICD-10-CM

## 2019-05-27 DIAGNOSIS — J449 Chronic obstructive pulmonary disease, unspecified: Secondary | ICD-10-CM

## 2019-05-27 DIAGNOSIS — I82409 Acute embolism and thrombosis of unspecified deep veins of unspecified lower extremity: Principal | ICD-10-CM

## 2019-05-27 DIAGNOSIS — R519 Headache: Secondary | ICD-10-CM

## 2019-05-28 DIAGNOSIS — F419 Anxiety disorder, unspecified: Secondary | ICD-10-CM

## 2019-05-28 DIAGNOSIS — I82409 Acute embolism and thrombosis of unspecified deep veins of unspecified lower extremity: Principal | ICD-10-CM

## 2019-05-28 DIAGNOSIS — D689 Coagulation defect, unspecified: Secondary | ICD-10-CM

## 2019-05-28 DIAGNOSIS — Z889 Allergy status to unspecified drugs, medicaments and biological substances status: Secondary | ICD-10-CM

## 2019-05-28 DIAGNOSIS — J45909 Unspecified asthma, uncomplicated: Secondary | ICD-10-CM

## 2019-05-28 DIAGNOSIS — J449 Chronic obstructive pulmonary disease, unspecified: Secondary | ICD-10-CM

## 2019-05-28 DIAGNOSIS — G4733 Obstructive sleep apnea (adult) (pediatric): Secondary | ICD-10-CM

## 2019-05-28 DIAGNOSIS — K219 Gastro-esophageal reflux disease without esophagitis: Secondary | ICD-10-CM

## 2019-05-28 DIAGNOSIS — F1910 Other psychoactive substance abuse, uncomplicated: Secondary | ICD-10-CM

## 2019-05-28 DIAGNOSIS — F329 Major depressive disorder, single episode, unspecified: Secondary | ICD-10-CM

## 2019-05-28 DIAGNOSIS — Z9109 Other allergy status, other than to drugs and biological substances: Secondary | ICD-10-CM

## 2019-05-28 DIAGNOSIS — D841 Defects in the complement system: Secondary | ICD-10-CM

## 2019-05-28 DIAGNOSIS — M1990 Unspecified osteoarthritis, unspecified site: Secondary | ICD-10-CM

## 2019-05-28 DIAGNOSIS — R519 Headache: Secondary | ICD-10-CM

## 2019-05-28 DIAGNOSIS — M8700 Idiopathic aseptic necrosis of unspecified bone: Secondary | ICD-10-CM

## 2019-06-04 ENCOUNTER — Ambulatory Visit
Admit: 2019-06-04 | Discharge: 2019-06-05 | Payer: Medicaid Other | Attending: Student in an Organized Health Care Education/Training Program | Primary: Family Medicine

## 2019-06-04 DIAGNOSIS — D689 Coagulation defect, unspecified: Secondary | ICD-10-CM

## 2019-06-04 DIAGNOSIS — J449 Chronic obstructive pulmonary disease, unspecified: Secondary | ICD-10-CM

## 2019-06-04 DIAGNOSIS — F329 Major depressive disorder, single episode, unspecified: Secondary | ICD-10-CM

## 2019-06-04 DIAGNOSIS — F419 Anxiety disorder, unspecified: Secondary | ICD-10-CM

## 2019-06-04 DIAGNOSIS — F1910 Other psychoactive substance abuse, uncomplicated: Secondary | ICD-10-CM

## 2019-06-04 DIAGNOSIS — J45909 Unspecified asthma, uncomplicated: Secondary | ICD-10-CM

## 2019-06-04 DIAGNOSIS — Z889 Allergy status to unspecified drugs, medicaments and biological substances status: Secondary | ICD-10-CM

## 2019-06-04 DIAGNOSIS — D682 Hereditary deficiency of other clotting factors: Principal | ICD-10-CM

## 2019-06-04 DIAGNOSIS — R519 Headache: Secondary | ICD-10-CM

## 2019-06-04 DIAGNOSIS — G4733 Obstructive sleep apnea (adult) (pediatric): Secondary | ICD-10-CM

## 2019-06-04 DIAGNOSIS — I82409 Acute embolism and thrombosis of unspecified deep veins of unspecified lower extremity: Principal | ICD-10-CM

## 2019-06-04 DIAGNOSIS — K219 Gastro-esophageal reflux disease without esophagitis: Secondary | ICD-10-CM

## 2019-06-04 DIAGNOSIS — M8700 Idiopathic aseptic necrosis of unspecified bone: Secondary | ICD-10-CM

## 2019-06-04 DIAGNOSIS — Z9109 Other allergy status, other than to drugs and biological substances: Secondary | ICD-10-CM

## 2019-06-04 DIAGNOSIS — D841 Defects in the complement system: Secondary | ICD-10-CM

## 2019-06-04 DIAGNOSIS — M1990 Unspecified osteoarthritis, unspecified site: Secondary | ICD-10-CM

## 2019-06-12 ENCOUNTER — Ambulatory Visit: Admit: 2019-06-12 | Payer: Medicaid Other | Primary: Family Medicine

## 2019-06-12 DIAGNOSIS — Z23 Encounter for immunization: Principal | ICD-10-CM

## 2019-06-22 ENCOUNTER — Ambulatory Visit: Payer: Medicaid Other | Attending: Family | Primary: Family Medicine

## 2019-06-22 DIAGNOSIS — G4733 Obstructive sleep apnea (adult) (pediatric): Secondary | ICD-10-CM

## 2019-06-22 DIAGNOSIS — D689 Coagulation defect, unspecified: Secondary | ICD-10-CM

## 2019-06-22 DIAGNOSIS — Z9109 Other allergy status, other than to drugs and biological substances: Secondary | ICD-10-CM

## 2019-06-22 DIAGNOSIS — Z87891 Personal history of nicotine dependence: Secondary | ICD-10-CM

## 2019-06-22 DIAGNOSIS — J45909 Unspecified asthma, uncomplicated: Secondary | ICD-10-CM

## 2019-06-22 DIAGNOSIS — R519 Headache: Secondary | ICD-10-CM

## 2019-06-22 DIAGNOSIS — M8700 Idiopathic aseptic necrosis of unspecified bone: Secondary | ICD-10-CM

## 2019-06-22 DIAGNOSIS — Z889 Allergy status to unspecified drugs, medicaments and biological substances status: Secondary | ICD-10-CM

## 2019-06-22 DIAGNOSIS — F1910 Other psychoactive substance abuse, uncomplicated: Secondary | ICD-10-CM

## 2019-06-22 DIAGNOSIS — Z6835 Body mass index (BMI) 35.0-35.9, adult: Secondary | ICD-10-CM

## 2019-06-22 DIAGNOSIS — J4540 Moderate persistent asthma, uncomplicated: Secondary | ICD-10-CM

## 2019-06-22 DIAGNOSIS — F419 Anxiety disorder, unspecified: Secondary | ICD-10-CM

## 2019-06-22 DIAGNOSIS — D841 Defects in the complement system: Secondary | ICD-10-CM

## 2019-06-22 DIAGNOSIS — I82409 Acute embolism and thrombosis of unspecified deep veins of unspecified lower extremity: Principal | ICD-10-CM

## 2019-06-22 DIAGNOSIS — Z23 Encounter for immunization: Secondary | ICD-10-CM

## 2019-06-22 DIAGNOSIS — K219 Gastro-esophageal reflux disease without esophagitis: Secondary | ICD-10-CM

## 2019-06-22 DIAGNOSIS — J449 Chronic obstructive pulmonary disease, unspecified: Secondary | ICD-10-CM

## 2019-06-22 DIAGNOSIS — F411 Generalized anxiety disorder: Principal | ICD-10-CM

## 2019-06-22 DIAGNOSIS — M1990 Unspecified osteoarthritis, unspecified site: Secondary | ICD-10-CM

## 2019-06-22 DIAGNOSIS — R197 Diarrhea, unspecified: Secondary | ICD-10-CM

## 2019-06-22 DIAGNOSIS — F17210 Nicotine dependence, cigarettes, uncomplicated: Secondary | ICD-10-CM

## 2019-06-22 DIAGNOSIS — F329 Major depressive disorder, single episode, unspecified: Secondary | ICD-10-CM

## 2019-06-22 MED ORDER — ZOSTER VAC RECOMB ADJUVANTED 50 MCG/0.5ML IM SUSR
.5 mL | Freq: Once | INTRAMUSCULAR | 0 refills | Status: CP
Start: 2019-06-22 — End: ?

## 2019-06-22 MED ORDER — ASPIRIN EC 81 MG PO TBEC
81 mg | Freq: Every day | ORAL
Start: 2019-06-22 — End: ?

## 2019-06-22 MED ORDER — CIPROFLOXACIN HCL 500 MG PO TABS
500 mg | Freq: Two times a day (BID) | ORAL | 0 refills | 7.00000 days | Status: CP
Start: 2019-06-22 — End: ?

## 2019-06-22 MED ORDER — METRONIDAZOLE 500 MG PO TABS
500 mg | Freq: Three times a day (TID) | ORAL | 0 refills | Status: CP
Start: 2019-06-22 — End: ?

## 2019-06-25 MED ORDER — METRONIDAZOLE 500 MG PO TABS
500 mg | Freq: Three times a day (TID) | ORAL | 0 refills | Status: CP
Start: 2019-06-25 — End: ?

## 2019-07-07 ENCOUNTER — Ambulatory Visit: Admit: 2019-07-07 | Discharge: 2019-07-07 | Attending: Family

## 2019-07-07 DIAGNOSIS — M8700 Idiopathic aseptic necrosis of unspecified bone: Secondary | ICD-10-CM

## 2019-07-07 DIAGNOSIS — J449 Chronic obstructive pulmonary disease, unspecified: Secondary | ICD-10-CM

## 2019-07-07 DIAGNOSIS — R519 Headache: Secondary | ICD-10-CM

## 2019-07-07 DIAGNOSIS — I82409 Acute embolism and thrombosis of unspecified deep veins of unspecified lower extremity: Principal | ICD-10-CM

## 2019-07-07 DIAGNOSIS — Z01818 Encounter for other preprocedural examination: Principal | ICD-10-CM

## 2019-07-07 DIAGNOSIS — M1990 Unspecified osteoarthritis, unspecified site: Secondary | ICD-10-CM

## 2019-07-07 DIAGNOSIS — G4733 Obstructive sleep apnea (adult) (pediatric): Secondary | ICD-10-CM

## 2019-07-07 DIAGNOSIS — D841 Defects in the complement system: Secondary | ICD-10-CM

## 2019-07-07 DIAGNOSIS — F419 Anxiety disorder, unspecified: Secondary | ICD-10-CM

## 2019-07-07 DIAGNOSIS — F329 Major depressive disorder, single episode, unspecified: Secondary | ICD-10-CM

## 2019-07-07 DIAGNOSIS — Z889 Allergy status to unspecified drugs, medicaments and biological substances status: Secondary | ICD-10-CM

## 2019-07-07 DIAGNOSIS — R197 Diarrhea, unspecified: Secondary | ICD-10-CM

## 2019-07-07 DIAGNOSIS — Z9109 Other allergy status, other than to drugs and biological substances: Secondary | ICD-10-CM

## 2019-07-07 DIAGNOSIS — Z6835 Body mass index (BMI) 35.0-35.9, adult: Secondary | ICD-10-CM

## 2019-07-07 DIAGNOSIS — D689 Coagulation defect, unspecified: Secondary | ICD-10-CM

## 2019-07-07 DIAGNOSIS — F1910 Other psychoactive substance abuse, uncomplicated: Secondary | ICD-10-CM

## 2019-07-07 DIAGNOSIS — J45909 Unspecified asthma, uncomplicated: Secondary | ICD-10-CM

## 2019-07-07 DIAGNOSIS — K219 Gastro-esophageal reflux disease without esophagitis: Secondary | ICD-10-CM

## 2019-08-05 ENCOUNTER — Ambulatory Visit: Admit: 2019-08-05 | Discharge: 2019-08-05 | Attending: Pulmonary Disease

## 2019-08-05 DIAGNOSIS — Z889 Allergy status to unspecified drugs, medicaments and biological substances status: Secondary | ICD-10-CM

## 2019-08-05 DIAGNOSIS — F1910 Other psychoactive substance abuse, uncomplicated: Secondary | ICD-10-CM

## 2019-08-05 DIAGNOSIS — J449 Chronic obstructive pulmonary disease, unspecified: Secondary | ICD-10-CM

## 2019-08-05 DIAGNOSIS — I82409 Acute embolism and thrombosis of unspecified deep veins of unspecified lower extremity: Principal | ICD-10-CM

## 2019-08-05 DIAGNOSIS — F329 Major depressive disorder, single episode, unspecified: Secondary | ICD-10-CM

## 2019-08-05 DIAGNOSIS — K219 Gastro-esophageal reflux disease without esophagitis: Secondary | ICD-10-CM

## 2019-08-05 DIAGNOSIS — J45909 Unspecified asthma, uncomplicated: Secondary | ICD-10-CM

## 2019-08-05 DIAGNOSIS — D841 Defects in the complement system: Secondary | ICD-10-CM

## 2019-08-05 DIAGNOSIS — Z9109 Other allergy status, other than to drugs and biological substances: Secondary | ICD-10-CM

## 2019-08-05 DIAGNOSIS — M1990 Unspecified osteoarthritis, unspecified site: Secondary | ICD-10-CM

## 2019-08-05 DIAGNOSIS — G4733 Obstructive sleep apnea (adult) (pediatric): Secondary | ICD-10-CM

## 2019-08-05 DIAGNOSIS — M8700 Idiopathic aseptic necrosis of unspecified bone: Secondary | ICD-10-CM

## 2019-08-05 DIAGNOSIS — R519 Headache: Secondary | ICD-10-CM

## 2019-08-05 DIAGNOSIS — F419 Anxiety disorder, unspecified: Secondary | ICD-10-CM

## 2019-08-05 DIAGNOSIS — D689 Coagulation defect, unspecified: Secondary | ICD-10-CM

## 2019-08-05 MED ORDER — VANCOMYCIN HCL 1000 MG IV SOLR MBP/V2B KIT JX
1000 mg | Freq: Once | INTRAVENOUS
Start: 2019-08-05 — End: ?

## 2019-08-05 MED ORDER — GABAPENTIN 300 MG PO CAPS
600 mg | Freq: Once | ORAL
Start: 2019-08-05 — End: ?

## 2019-08-05 MED ORDER — CEFAZOLIN SODIUM 1 G IJ SOLR
2 g | Freq: Once | INTRAVENOUS
Start: 2019-08-05 — End: ?

## 2019-08-05 MED ORDER — SODIUM CHLORIDE 0.9 % IV SOLN
1000 mL | INTRAVENOUS
Start: 2019-08-05 — End: ?

## 2019-08-05 MED ORDER — ACETAMINOPHEN 500 MG PO TABS
1000 mg | Freq: Once | ORAL
Start: 2019-08-05 — End: ?

## 2019-08-25 ENCOUNTER — Ambulatory Visit: Admit: 2019-08-25 | Discharge: 2019-08-25 | Attending: Family

## 2019-08-25 DIAGNOSIS — R197 Diarrhea, unspecified: Principal | ICD-10-CM

## 2019-08-25 DIAGNOSIS — R1011 Right upper quadrant pain: Secondary | ICD-10-CM

## 2019-08-25 MED ORDER — CULTURELLE PO CAPS
1 | ORAL_CAPSULE | Freq: Two times a day (BID) | ORAL | 3 refills | Status: CP
Start: 2019-08-25 — End: ?

## 2019-08-26 ENCOUNTER — Ambulatory Visit: Admit: 2019-08-26 | Discharge: 2019-08-26 | Attending: Pulmonary Disease

## 2019-09-03 ENCOUNTER — Ambulatory Visit: Admit: 2019-09-03 | Discharge: 2019-09-03

## 2019-09-03 DIAGNOSIS — R1011 Right upper quadrant pain: Principal | ICD-10-CM

## 2019-09-08 ENCOUNTER — Ambulatory Visit: Payer: Medicaid Other | Attending: Family Medicine | Primary: Family Medicine

## 2019-09-08 DIAGNOSIS — M8700 Idiopathic aseptic necrosis of unspecified bone: Secondary | ICD-10-CM

## 2019-09-08 DIAGNOSIS — J45909 Unspecified asthma, uncomplicated: Secondary | ICD-10-CM

## 2019-09-08 DIAGNOSIS — R519 Headache: Secondary | ICD-10-CM

## 2019-09-08 DIAGNOSIS — J449 Chronic obstructive pulmonary disease, unspecified: Secondary | ICD-10-CM

## 2019-09-08 DIAGNOSIS — F329 Major depressive disorder, single episode, unspecified: Secondary | ICD-10-CM

## 2019-09-08 DIAGNOSIS — Z6835 Body mass index (BMI) 35.0-35.9, adult: Principal | ICD-10-CM

## 2019-09-08 DIAGNOSIS — F1910 Other psychoactive substance abuse, uncomplicated: Secondary | ICD-10-CM

## 2019-09-08 DIAGNOSIS — M1990 Unspecified osteoarthritis, unspecified site: Secondary | ICD-10-CM

## 2019-09-08 DIAGNOSIS — D841 Defects in the complement system: Secondary | ICD-10-CM

## 2019-09-08 DIAGNOSIS — D689 Coagulation defect, unspecified: Secondary | ICD-10-CM

## 2019-09-08 DIAGNOSIS — G4733 Obstructive sleep apnea (adult) (pediatric): Secondary | ICD-10-CM

## 2019-09-08 DIAGNOSIS — H66002 Acute suppurative otitis media without spontaneous rupture of ear drum, left ear: Secondary | ICD-10-CM

## 2019-09-08 DIAGNOSIS — Z889 Allergy status to unspecified drugs, medicaments and biological substances status: Secondary | ICD-10-CM

## 2019-09-08 DIAGNOSIS — Z9109 Other allergy status, other than to drugs and biological substances: Secondary | ICD-10-CM

## 2019-09-08 DIAGNOSIS — K219 Gastro-esophageal reflux disease without esophagitis: Secondary | ICD-10-CM

## 2019-09-08 DIAGNOSIS — F419 Anxiety disorder, unspecified: Secondary | ICD-10-CM

## 2019-09-08 DIAGNOSIS — I82409 Acute embolism and thrombosis of unspecified deep veins of unspecified lower extremity: Principal | ICD-10-CM

## 2019-09-08 MED ORDER — DIPHENHYDRAMINE HCL 50 MG/ML IJ SOLN
25 mg | Freq: Once | INTRAVENOUS | Status: CP
Start: 2019-09-08 — End: ?

## 2019-09-08 MED ORDER — NEOMYCIN-POLYMYXIN-HC 3.5-10000-1 OT SOLN
3 [drp] | Freq: Three times a day (TID) | OTIC | 1 refills | 10.00000 days | Status: CP
Start: 2019-09-08 — End: ?

## 2019-09-08 MED ORDER — METHYLPREDNISOLONE SODIUM SUCC 125 MG IJ SOLR
125 mg | Freq: Once | INTRAMUSCULAR | Status: CP
Start: 2019-09-08 — End: ?

## 2019-09-11 DIAGNOSIS — D841 Defects in the complement system: Secondary | ICD-10-CM

## 2019-09-11 DIAGNOSIS — J45909 Unspecified asthma, uncomplicated: Secondary | ICD-10-CM

## 2019-09-11 DIAGNOSIS — G4733 Obstructive sleep apnea (adult) (pediatric): Secondary | ICD-10-CM

## 2019-09-11 DIAGNOSIS — I82409 Acute embolism and thrombosis of unspecified deep veins of unspecified lower extremity: Principal | ICD-10-CM

## 2019-09-11 DIAGNOSIS — M1990 Unspecified osteoarthritis, unspecified site: Secondary | ICD-10-CM

## 2019-09-11 DIAGNOSIS — F329 Major depressive disorder, single episode, unspecified: Secondary | ICD-10-CM

## 2019-09-11 DIAGNOSIS — F419 Anxiety disorder, unspecified: Secondary | ICD-10-CM

## 2019-09-11 DIAGNOSIS — K219 Gastro-esophageal reflux disease without esophagitis: Secondary | ICD-10-CM

## 2019-09-11 DIAGNOSIS — M8700 Idiopathic aseptic necrosis of unspecified bone: Secondary | ICD-10-CM

## 2019-09-11 DIAGNOSIS — Z889 Allergy status to unspecified drugs, medicaments and biological substances status: Secondary | ICD-10-CM

## 2019-09-11 DIAGNOSIS — J449 Chronic obstructive pulmonary disease, unspecified: Secondary | ICD-10-CM

## 2019-09-11 DIAGNOSIS — D689 Coagulation defect, unspecified: Secondary | ICD-10-CM

## 2019-09-11 DIAGNOSIS — F1910 Other psychoactive substance abuse, uncomplicated: Secondary | ICD-10-CM

## 2019-09-11 DIAGNOSIS — Z9109 Other allergy status, other than to drugs and biological substances: Secondary | ICD-10-CM

## 2019-09-11 DIAGNOSIS — R519 Headache: Secondary | ICD-10-CM

## 2019-09-27 DIAGNOSIS — I671 Cerebral aneurysm, nonruptured: Principal | ICD-10-CM

## 2019-10-11 ENCOUNTER — Ambulatory Visit
Admit: 2019-10-11 | Discharge: 2019-10-12 | Payer: Medicaid Other | Attending: Sports Medicine | Primary: Family Medicine

## 2019-10-11 DIAGNOSIS — J45909 Unspecified asthma, uncomplicated: Secondary | ICD-10-CM

## 2019-10-11 DIAGNOSIS — D841 Defects in the complement system: Secondary | ICD-10-CM

## 2019-10-11 DIAGNOSIS — K219 Gastro-esophageal reflux disease without esophagitis: Secondary | ICD-10-CM

## 2019-10-11 DIAGNOSIS — Z9109 Other allergy status, other than to drugs and biological substances: Secondary | ICD-10-CM

## 2019-10-11 DIAGNOSIS — Z96642 Presence of left artificial hip joint: Principal | ICD-10-CM

## 2019-10-11 DIAGNOSIS — Z889 Allergy status to unspecified drugs, medicaments and biological substances status: Secondary | ICD-10-CM

## 2019-10-11 DIAGNOSIS — G8929 Other chronic pain: Secondary | ICD-10-CM

## 2019-10-11 DIAGNOSIS — D689 Coagulation defect, unspecified: Secondary | ICD-10-CM

## 2019-10-11 DIAGNOSIS — I82409 Acute embolism and thrombosis of unspecified deep veins of unspecified lower extremity: Principal | ICD-10-CM

## 2019-10-11 DIAGNOSIS — G4733 Obstructive sleep apnea (adult) (pediatric): Secondary | ICD-10-CM

## 2019-10-11 DIAGNOSIS — M8700 Idiopathic aseptic necrosis of unspecified bone: Secondary | ICD-10-CM

## 2019-10-11 DIAGNOSIS — J449 Chronic obstructive pulmonary disease, unspecified: Secondary | ICD-10-CM

## 2019-10-11 DIAGNOSIS — M1990 Unspecified osteoarthritis, unspecified site: Secondary | ICD-10-CM

## 2019-10-11 DIAGNOSIS — F1910 Other psychoactive substance abuse, uncomplicated: Secondary | ICD-10-CM

## 2019-10-11 DIAGNOSIS — R519 Headache: Secondary | ICD-10-CM

## 2019-10-11 DIAGNOSIS — F419 Anxiety disorder, unspecified: Secondary | ICD-10-CM

## 2019-10-11 DIAGNOSIS — F329 Major depressive disorder, single episode, unspecified: Secondary | ICD-10-CM

## 2019-10-11 DIAGNOSIS — M25552 Pain in left hip: Secondary | ICD-10-CM

## 2019-11-29 ENCOUNTER — Inpatient Hospital Stay: Admit: 2019-11-29 | Payer: Medicaid Other | Attending: Sports Medicine | Primary: Family Medicine

## 2020-01-26 ENCOUNTER — Ambulatory Visit: Payer: Medicaid Other | Attending: Family Medicine | Primary: Family Medicine

## 2020-01-26 DIAGNOSIS — Z124 Encounter for screening for malignant neoplasm of cervix: Secondary | ICD-10-CM

## 2020-01-26 DIAGNOSIS — G4733 Obstructive sleep apnea (adult) (pediatric): Secondary | ICD-10-CM

## 2020-01-26 DIAGNOSIS — J45909 Unspecified asthma, uncomplicated: Secondary | ICD-10-CM

## 2020-01-26 DIAGNOSIS — D841 Defects in the complement system: Secondary | ICD-10-CM

## 2020-01-26 DIAGNOSIS — J449 Chronic obstructive pulmonary disease, unspecified: Secondary | ICD-10-CM

## 2020-01-26 DIAGNOSIS — Z6832 Body mass index (BMI) 32.0-32.9, adult: Principal | ICD-10-CM

## 2020-01-26 DIAGNOSIS — Z9109 Other allergy status, other than to drugs and biological substances: Secondary | ICD-10-CM

## 2020-01-26 DIAGNOSIS — K660 Peritoneal adhesions (postprocedural) (postinfection): Secondary | ICD-10-CM

## 2020-01-26 DIAGNOSIS — R102 Pelvic and perineal pain: Secondary | ICD-10-CM

## 2020-01-26 DIAGNOSIS — I82409 Acute embolism and thrombosis of unspecified deep veins of unspecified lower extremity: Principal | ICD-10-CM

## 2020-01-26 DIAGNOSIS — N952 Postmenopausal atrophic vaginitis: Secondary | ICD-10-CM

## 2020-01-26 DIAGNOSIS — Z889 Allergy status to unspecified drugs, medicaments and biological substances status: Secondary | ICD-10-CM

## 2020-01-26 DIAGNOSIS — R519 Headache: Secondary | ICD-10-CM

## 2020-01-26 DIAGNOSIS — K219 Gastro-esophageal reflux disease without esophagitis: Secondary | ICD-10-CM

## 2020-01-26 DIAGNOSIS — M1990 Unspecified osteoarthritis, unspecified site: Secondary | ICD-10-CM

## 2020-01-26 DIAGNOSIS — E6609 Other obesity due to excess calories: Secondary | ICD-10-CM

## 2020-01-26 DIAGNOSIS — T162XXA Foreign body in left ear, initial encounter: Secondary | ICD-10-CM

## 2020-01-26 DIAGNOSIS — F1910 Other psychoactive substance abuse, uncomplicated: Secondary | ICD-10-CM

## 2020-01-26 DIAGNOSIS — D689 Coagulation defect, unspecified: Secondary | ICD-10-CM

## 2020-01-26 DIAGNOSIS — F32A Depression: Secondary | ICD-10-CM

## 2020-01-26 DIAGNOSIS — M8700 Idiopathic aseptic necrosis of unspecified bone: Secondary | ICD-10-CM

## 2020-01-26 DIAGNOSIS — F419 Anxiety disorder, unspecified: Secondary | ICD-10-CM

## 2020-01-26 MED ORDER — ESTROGENS, CONJUGATED 0.625 MG/GM VA CREA
VAGINAL | 3 refills | 161.00000 days | Status: CP
Start: 2020-01-26 — End: ?

## 2020-01-27 ENCOUNTER — Inpatient Hospital Stay: Admit: 2020-01-27 | Discharge: 2020-01-28 | Payer: Medicaid Other | Primary: Family Medicine

## 2020-01-27 DIAGNOSIS — K660 Peritoneal adhesions (postprocedural) (postinfection): Principal | ICD-10-CM

## 2020-01-29 DIAGNOSIS — J4540 Moderate persistent asthma, uncomplicated: Principal | ICD-10-CM

## 2020-01-29 DIAGNOSIS — J302 Other seasonal allergic rhinitis: Secondary | ICD-10-CM

## 2020-01-30 DIAGNOSIS — M8700 Idiopathic aseptic necrosis of unspecified bone: Secondary | ICD-10-CM

## 2020-01-30 DIAGNOSIS — Z9109 Other allergy status, other than to drugs and biological substances: Secondary | ICD-10-CM

## 2020-01-30 DIAGNOSIS — I82409 Acute embolism and thrombosis of unspecified deep veins of unspecified lower extremity: Principal | ICD-10-CM

## 2020-01-30 DIAGNOSIS — F32A Depression: Secondary | ICD-10-CM

## 2020-01-30 DIAGNOSIS — M1990 Unspecified osteoarthritis, unspecified site: Secondary | ICD-10-CM

## 2020-01-30 DIAGNOSIS — K219 Gastro-esophageal reflux disease without esophagitis: Secondary | ICD-10-CM

## 2020-01-30 DIAGNOSIS — F1910 Other psychoactive substance abuse, uncomplicated: Secondary | ICD-10-CM

## 2020-01-30 DIAGNOSIS — J45909 Unspecified asthma, uncomplicated: Secondary | ICD-10-CM

## 2020-01-30 DIAGNOSIS — F419 Anxiety disorder, unspecified: Secondary | ICD-10-CM

## 2020-01-30 DIAGNOSIS — R519 Headache: Secondary | ICD-10-CM

## 2020-01-30 DIAGNOSIS — D689 Coagulation defect, unspecified: Secondary | ICD-10-CM

## 2020-01-30 DIAGNOSIS — D841 Defects in the complement system: Secondary | ICD-10-CM

## 2020-01-30 DIAGNOSIS — G4733 Obstructive sleep apnea (adult) (pediatric): Secondary | ICD-10-CM

## 2020-01-30 DIAGNOSIS — Z889 Allergy status to unspecified drugs, medicaments and biological substances status: Secondary | ICD-10-CM

## 2020-01-30 DIAGNOSIS — J449 Chronic obstructive pulmonary disease, unspecified: Secondary | ICD-10-CM

## 2020-02-03 ENCOUNTER — Encounter: Payer: Medicaid Other | Primary: Family Medicine

## 2020-02-09 ENCOUNTER — Encounter: Payer: Medicaid Other | Primary: Family Medicine

## 2020-02-09 DIAGNOSIS — R102 Pelvic and perineal pain: Principal | ICD-10-CM

## 2020-02-16 ENCOUNTER — Telehealth
Admit: 2020-02-16 | Discharge: 2020-02-17 | Payer: Medicaid Other | Attending: Nurse Practitioner | Primary: Family Medicine

## 2020-02-16 DIAGNOSIS — I671 Cerebral aneurysm, nonruptured: Principal | ICD-10-CM

## 2020-02-24 DIAGNOSIS — I671 Cerebral aneurysm, nonruptured: Principal | ICD-10-CM

## 2020-02-29 ENCOUNTER — Encounter: Attending: Sports Medicine | Primary: Family Medicine

## 2020-03-21 ENCOUNTER — Inpatient Hospital Stay: Admit: 2020-03-21 | Discharge: 2020-03-22 | Payer: Medicaid Other | Primary: Family Medicine

## 2020-03-21 DIAGNOSIS — I671 Cerebral aneurysm, nonruptured: Principal | ICD-10-CM

## 2020-03-21 MED ORDER — MIDAZOLAM HCL 2 MG/2ML IJ SOLN
.5-1 mg | INTRAVENOUS | Status: CP | PRN
Start: 2020-03-21 — End: ?

## 2020-03-21 MED ORDER — IOHEXOL 300 MG/ML IJ SOLN
400 mL | Freq: Once | INTRA_ARTERIAL | Status: CP
Start: 2020-03-21 — End: ?

## 2020-03-21 MED ORDER — SODIUM CHLORIDE 0.9% FOR FLUSHES
20-180 mL | Status: CP | PRN
Start: 2020-03-21 — End: ?

## 2020-03-21 MED ORDER — FENTANYL CITRATE INJ 50 MCG/ML CUSTOM AMP/VIAL
25-50 ug | INTRAVENOUS | Status: CP | PRN
Start: 2020-03-21 — End: ?

## 2020-03-21 MED ORDER — LIDOCAINE HCL (PF) 2 % IJ SOLN SH
Freq: Once | SUBCUTANEOUS | Status: CP
Start: 2020-03-21 — End: ?

## 2020-04-13 DIAGNOSIS — J4540 Moderate persistent asthma, uncomplicated: Principal | ICD-10-CM

## 2020-04-13 MED ORDER — SYMBICORT 160-4.5 MCG/ACT IN AERO
12 refills | Status: CP
Start: 2020-04-13 — End: ?

## 2020-05-18 ENCOUNTER — Encounter: Payer: Medicaid Other | Attending: Otolaryngology | Primary: Family Medicine

## 2020-09-08 MED ORDER — PROAIR HFA 108 (90 BASE) MCG/ACT IN AERS
3 refills
Start: 2020-09-08 — End: ?

## 2021-01-28 DIAGNOSIS — N952 Postmenopausal atrophic vaginitis: Principal | ICD-10-CM

## 2021-01-28 MED ORDER — PREMARIN 0.625 MG/GM VA CREA
VAGINAL | 1 refills | 161.00000 days | Status: CP
Start: 2021-01-28 — End: ?

## 2021-02-07 DIAGNOSIS — K219 Gastro-esophageal reflux disease without esophagitis: Secondary | ICD-10-CM

## 2021-02-07 DIAGNOSIS — F32A Depression: Secondary | ICD-10-CM

## 2021-02-07 DIAGNOSIS — F419 Anxiety disorder, unspecified: Secondary | ICD-10-CM

## 2021-02-07 DIAGNOSIS — M8700 Idiopathic aseptic necrosis of unspecified bone: Secondary | ICD-10-CM

## 2021-02-07 DIAGNOSIS — D689 Coagulation defect, unspecified: Secondary | ICD-10-CM

## 2021-02-07 DIAGNOSIS — M1990 Unspecified osteoarthritis, unspecified site: Secondary | ICD-10-CM

## 2021-02-07 DIAGNOSIS — R519 Headache: Secondary | ICD-10-CM

## 2021-02-07 DIAGNOSIS — J45909 Unspecified asthma, uncomplicated: Secondary | ICD-10-CM

## 2021-02-07 DIAGNOSIS — J449 Chronic obstructive pulmonary disease, unspecified: Secondary | ICD-10-CM

## 2021-02-07 DIAGNOSIS — F1910 Other psychoactive substance abuse, uncomplicated: Secondary | ICD-10-CM

## 2021-02-07 DIAGNOSIS — I82409 Acute embolism and thrombosis of unspecified deep veins of unspecified lower extremity: Principal | ICD-10-CM

## 2021-02-07 DIAGNOSIS — G4733 Obstructive sleep apnea (adult) (pediatric): Secondary | ICD-10-CM

## 2021-02-07 DIAGNOSIS — Z889 Allergy status to unspecified drugs, medicaments and biological substances status: Secondary | ICD-10-CM

## 2021-02-07 DIAGNOSIS — Z9109 Other allergy status, other than to drugs and biological substances: Secondary | ICD-10-CM

## 2021-02-07 DIAGNOSIS — D841 Defects in the complement system: Secondary | ICD-10-CM

## 2021-04-06 ENCOUNTER — Encounter: Payer: Medicaid Other | Attending: Family Medicine | Primary: Family Medicine

## 2021-04-13 ENCOUNTER — Encounter: Payer: Medicaid Other | Attending: Family Medicine | Primary: Family Medicine

## 2021-04-13 ENCOUNTER — Encounter: Payer: Medicaid Other | Attending: Medical | Primary: Family Medicine

## 2021-04-18 ENCOUNTER — Ambulatory Visit: Payer: Medicaid Other | Attending: Family Medicine | Primary: Family Medicine

## 2021-04-18 DIAGNOSIS — G8929 Other chronic pain: Secondary | ICD-10-CM

## 2021-04-18 DIAGNOSIS — M25511 Pain in right shoulder: Secondary | ICD-10-CM

## 2021-04-18 DIAGNOSIS — K219 Gastro-esophageal reflux disease without esophagitis: Secondary | ICD-10-CM

## 2021-04-18 DIAGNOSIS — F32A Depression: Secondary | ICD-10-CM

## 2021-04-18 DIAGNOSIS — D841 Defects in the complement system: Secondary | ICD-10-CM

## 2021-04-18 DIAGNOSIS — F1910 Other psychoactive substance abuse, uncomplicated: Secondary | ICD-10-CM

## 2021-04-18 DIAGNOSIS — J449 Chronic obstructive pulmonary disease, unspecified: Secondary | ICD-10-CM

## 2021-04-18 DIAGNOSIS — I82409 Acute embolism and thrombosis of unspecified deep veins of unspecified lower extremity: Principal | ICD-10-CM

## 2021-04-18 DIAGNOSIS — Z889 Allergy status to unspecified drugs, medicaments and biological substances status: Secondary | ICD-10-CM

## 2021-04-18 DIAGNOSIS — M25512 Pain in left shoulder: Secondary | ICD-10-CM

## 2021-04-18 DIAGNOSIS — Z6832 Body mass index (BMI) 32.0-32.9, adult: Principal | ICD-10-CM

## 2021-04-18 DIAGNOSIS — F419 Anxiety disorder, unspecified: Secondary | ICD-10-CM

## 2021-04-18 DIAGNOSIS — Z96642 Presence of left artificial hip joint: Secondary | ICD-10-CM

## 2021-04-18 DIAGNOSIS — J45909 Unspecified asthma, uncomplicated: Secondary | ICD-10-CM

## 2021-04-18 DIAGNOSIS — M1990 Unspecified osteoarthritis, unspecified site: Secondary | ICD-10-CM

## 2021-04-18 DIAGNOSIS — R519 Headache: Secondary | ICD-10-CM

## 2021-04-18 DIAGNOSIS — E669 Obesity, unspecified: Secondary | ICD-10-CM

## 2021-04-18 DIAGNOSIS — M25552 Pain in left hip: Secondary | ICD-10-CM

## 2021-04-18 DIAGNOSIS — M8700 Idiopathic aseptic necrosis of unspecified bone: Secondary | ICD-10-CM

## 2021-04-18 DIAGNOSIS — Z9109 Other allergy status, other than to drugs and biological substances: Secondary | ICD-10-CM

## 2021-04-18 DIAGNOSIS — G4733 Obstructive sleep apnea (adult) (pediatric): Secondary | ICD-10-CM

## 2021-04-18 DIAGNOSIS — D689 Coagulation defect, unspecified: Secondary | ICD-10-CM

## 2021-04-18 MED ORDER — METHYLPREDNISOLONE SODIUM SUCC 125 MG IJ SOLR
125 mg | Freq: Once | INTRAMUSCULAR | Status: CP
Start: 2021-04-18 — End: ?

## 2021-04-18 MED ORDER — KETOROLAC TROMETHAMINE 60 MG/2ML IM SOLN
60 mg | Freq: Once | INTRAMUSCULAR | Status: CP
Start: 2021-04-18 — End: ?

## 2021-04-22 DIAGNOSIS — D689 Coagulation defect, unspecified: Secondary | ICD-10-CM

## 2021-04-22 DIAGNOSIS — Z889 Allergy status to unspecified drugs, medicaments and biological substances status: Secondary | ICD-10-CM

## 2021-04-22 DIAGNOSIS — Z9109 Other allergy status, other than to drugs and biological substances: Secondary | ICD-10-CM

## 2021-04-22 DIAGNOSIS — M1990 Unspecified osteoarthritis, unspecified site: Secondary | ICD-10-CM

## 2021-04-22 DIAGNOSIS — G4733 Obstructive sleep apnea (adult) (pediatric): Secondary | ICD-10-CM

## 2021-04-22 DIAGNOSIS — F419 Anxiety disorder, unspecified: Secondary | ICD-10-CM

## 2021-04-22 DIAGNOSIS — F1910 Other psychoactive substance abuse, uncomplicated: Secondary | ICD-10-CM

## 2021-04-22 DIAGNOSIS — K219 Gastro-esophageal reflux disease without esophagitis: Secondary | ICD-10-CM

## 2021-04-22 DIAGNOSIS — J45909 Unspecified asthma, uncomplicated: Secondary | ICD-10-CM

## 2021-04-22 DIAGNOSIS — I82409 Acute embolism and thrombosis of unspecified deep veins of unspecified lower extremity: Principal | ICD-10-CM

## 2021-04-22 DIAGNOSIS — F32A Depression: Secondary | ICD-10-CM

## 2021-04-22 DIAGNOSIS — J449 Chronic obstructive pulmonary disease, unspecified: Secondary | ICD-10-CM

## 2021-04-22 DIAGNOSIS — D841 Defects in the complement system: Secondary | ICD-10-CM

## 2021-04-22 DIAGNOSIS — R519 Headache: Secondary | ICD-10-CM

## 2021-04-22 DIAGNOSIS — M8700 Idiopathic aseptic necrosis of unspecified bone: Secondary | ICD-10-CM

## 2021-06-05 ENCOUNTER — Encounter: Payer: Medicare Other | Attending: Physician Assistant | Primary: Family Medicine

## 2021-06-18 ENCOUNTER — Encounter: Payer: Medicare Other | Attending: Family Medicine | Primary: Family Medicine

## 2021-06-20 ENCOUNTER — Encounter: Payer: Medicare Other | Attending: Physician Assistant | Primary: Family Medicine

## 2021-06-28 ENCOUNTER — Encounter: Payer: Medicare Other | Attending: Physician Assistant | Primary: Family Medicine

## 2021-06-29 ENCOUNTER — Ambulatory Visit: Payer: Medicare Other | Attending: Physician Assistant | Primary: Family Medicine

## 2021-06-29 DIAGNOSIS — M8700 Idiopathic aseptic necrosis of unspecified bone: Secondary | ICD-10-CM

## 2021-06-29 DIAGNOSIS — R519 Headache: Secondary | ICD-10-CM

## 2021-06-29 DIAGNOSIS — J449 Chronic obstructive pulmonary disease, unspecified: Secondary | ICD-10-CM

## 2021-06-29 DIAGNOSIS — F1910 Other psychoactive substance abuse, uncomplicated: Secondary | ICD-10-CM

## 2021-06-29 DIAGNOSIS — Z6831 Body mass index (BMI) 31.0-31.9, adult: Secondary | ICD-10-CM

## 2021-06-29 DIAGNOSIS — R5383 Other fatigue: Secondary | ICD-10-CM

## 2021-06-29 DIAGNOSIS — M1990 Unspecified osteoarthritis, unspecified site: Secondary | ICD-10-CM

## 2021-06-29 DIAGNOSIS — F32A Depression: Secondary | ICD-10-CM

## 2021-06-29 DIAGNOSIS — Z0000 Encounter for general adult medical examination without abnormal findings: Secondary | ICD-10-CM

## 2021-06-29 DIAGNOSIS — J45909 Unspecified asthma, uncomplicated: Secondary | ICD-10-CM

## 2021-06-29 DIAGNOSIS — Z889 Allergy status to unspecified drugs, medicaments and biological substances status: Secondary | ICD-10-CM

## 2021-06-29 DIAGNOSIS — G4733 Obstructive sleep apnea (adult) (pediatric): Secondary | ICD-10-CM

## 2021-06-29 DIAGNOSIS — E039 Hypothyroidism, unspecified: Secondary | ICD-10-CM

## 2021-06-29 DIAGNOSIS — E782 Mixed hyperlipidemia: Secondary | ICD-10-CM

## 2021-06-29 DIAGNOSIS — R5381 Other malaise: Principal | ICD-10-CM

## 2021-06-29 DIAGNOSIS — D841 Defects in the complement system: Secondary | ICD-10-CM

## 2021-06-29 DIAGNOSIS — Z9109 Other allergy status, other than to drugs and biological substances: Secondary | ICD-10-CM

## 2021-06-29 DIAGNOSIS — K219 Gastro-esophageal reflux disease without esophagitis: Secondary | ICD-10-CM

## 2021-06-29 DIAGNOSIS — F419 Anxiety disorder, unspecified: Secondary | ICD-10-CM

## 2021-06-29 DIAGNOSIS — D689 Coagulation defect, unspecified: Secondary | ICD-10-CM

## 2021-06-29 DIAGNOSIS — I82409 Acute embolism and thrombosis of unspecified deep veins of unspecified lower extremity: Principal | ICD-10-CM

## 2021-07-01 DIAGNOSIS — F419 Anxiety disorder, unspecified: Secondary | ICD-10-CM

## 2021-07-01 DIAGNOSIS — R519 Headache: Secondary | ICD-10-CM

## 2021-07-01 DIAGNOSIS — I82409 Acute embolism and thrombosis of unspecified deep veins of unspecified lower extremity: Principal | ICD-10-CM

## 2021-07-01 DIAGNOSIS — K219 Gastro-esophageal reflux disease without esophagitis: Secondary | ICD-10-CM

## 2021-07-01 DIAGNOSIS — Z889 Allergy status to unspecified drugs, medicaments and biological substances status: Secondary | ICD-10-CM

## 2021-07-01 DIAGNOSIS — F32A Depression: Secondary | ICD-10-CM

## 2021-07-01 DIAGNOSIS — M1990 Unspecified osteoarthritis, unspecified site: Secondary | ICD-10-CM

## 2021-07-01 DIAGNOSIS — G4733 Obstructive sleep apnea (adult) (pediatric): Secondary | ICD-10-CM

## 2021-07-01 DIAGNOSIS — J45909 Unspecified asthma, uncomplicated: Secondary | ICD-10-CM

## 2021-07-01 DIAGNOSIS — D841 Defects in the complement system: Secondary | ICD-10-CM

## 2021-07-01 DIAGNOSIS — D689 Coagulation defect, unspecified: Secondary | ICD-10-CM

## 2021-07-01 DIAGNOSIS — Z9109 Other allergy status, other than to drugs and biological substances: Secondary | ICD-10-CM

## 2021-07-01 DIAGNOSIS — J449 Chronic obstructive pulmonary disease, unspecified: Secondary | ICD-10-CM

## 2021-07-01 DIAGNOSIS — M8700 Idiopathic aseptic necrosis of unspecified bone: Secondary | ICD-10-CM

## 2021-07-01 DIAGNOSIS — F1910 Other psychoactive substance abuse, uncomplicated: Secondary | ICD-10-CM

## 2021-07-05 ENCOUNTER — Encounter: Payer: Medicare Other | Primary: Family Medicine

## 2021-07-10 ENCOUNTER — Encounter: Payer: Medicare Other | Primary: Family Medicine

## 2021-07-10 DIAGNOSIS — Z889 Allergy status to unspecified drugs, medicaments and biological substances status: Secondary | ICD-10-CM

## 2021-07-10 DIAGNOSIS — Z9109 Other allergy status, other than to drugs and biological substances: Secondary | ICD-10-CM

## 2021-07-10 DIAGNOSIS — G4733 Obstructive sleep apnea (adult) (pediatric): Secondary | ICD-10-CM

## 2021-07-10 DIAGNOSIS — M8700 Idiopathic aseptic necrosis of unspecified bone: Secondary | ICD-10-CM

## 2021-07-10 DIAGNOSIS — F1910 Other psychoactive substance abuse, uncomplicated: Secondary | ICD-10-CM

## 2021-07-10 DIAGNOSIS — K219 Gastro-esophageal reflux disease without esophagitis: Secondary | ICD-10-CM

## 2021-07-10 DIAGNOSIS — Z1231 Encounter for screening mammogram for malignant neoplasm of breast: Principal | ICD-10-CM

## 2021-07-10 DIAGNOSIS — M1990 Unspecified osteoarthritis, unspecified site: Secondary | ICD-10-CM

## 2021-07-10 DIAGNOSIS — J45909 Unspecified asthma, uncomplicated: Secondary | ICD-10-CM

## 2021-07-10 DIAGNOSIS — D689 Coagulation defect, unspecified: Secondary | ICD-10-CM

## 2021-07-10 DIAGNOSIS — J449 Chronic obstructive pulmonary disease, unspecified: Secondary | ICD-10-CM

## 2021-07-10 DIAGNOSIS — D841 Defects in the complement system: Secondary | ICD-10-CM

## 2021-07-10 DIAGNOSIS — R519 Headache: Secondary | ICD-10-CM

## 2021-07-10 DIAGNOSIS — Z96642 Presence of left artificial hip joint: Secondary | ICD-10-CM

## 2021-07-10 DIAGNOSIS — F32A Depression: Secondary | ICD-10-CM

## 2021-07-10 DIAGNOSIS — I82409 Acute embolism and thrombosis of unspecified deep veins of unspecified lower extremity: Principal | ICD-10-CM

## 2021-07-10 DIAGNOSIS — F419 Anxiety disorder, unspecified: Secondary | ICD-10-CM

## 2021-07-13 ENCOUNTER — Ambulatory Visit: Payer: Medicare Other | Attending: Family Medicine | Primary: Family Medicine

## 2021-07-13 DIAGNOSIS — F419 Anxiety disorder, unspecified: Secondary | ICD-10-CM

## 2021-07-13 DIAGNOSIS — G4733 Obstructive sleep apnea (adult) (pediatric): Secondary | ICD-10-CM

## 2021-07-13 DIAGNOSIS — D841 Defects in the complement system: Secondary | ICD-10-CM

## 2021-07-13 DIAGNOSIS — M8700 Idiopathic aseptic necrosis of unspecified bone: Secondary | ICD-10-CM

## 2021-07-13 DIAGNOSIS — Z9109 Other allergy status, other than to drugs and biological substances: Secondary | ICD-10-CM

## 2021-07-13 DIAGNOSIS — E669 Obesity, unspecified: Secondary | ICD-10-CM

## 2021-07-13 DIAGNOSIS — F32A Depression: Secondary | ICD-10-CM

## 2021-07-13 DIAGNOSIS — R519 Headache: Secondary | ICD-10-CM

## 2021-07-13 DIAGNOSIS — D689 Coagulation defect, unspecified: Secondary | ICD-10-CM

## 2021-07-13 DIAGNOSIS — Z96642 Presence of left artificial hip joint: Secondary | ICD-10-CM

## 2021-07-13 DIAGNOSIS — E782 Mixed hyperlipidemia: Secondary | ICD-10-CM

## 2021-07-13 DIAGNOSIS — Z96641 Presence of right artificial hip joint: Secondary | ICD-10-CM

## 2021-07-13 DIAGNOSIS — Z889 Allergy status to unspecified drugs, medicaments and biological substances status: Secondary | ICD-10-CM

## 2021-07-13 DIAGNOSIS — I82409 Acute embolism and thrombosis of unspecified deep veins of unspecified lower extremity: Principal | ICD-10-CM

## 2021-07-13 DIAGNOSIS — K219 Gastro-esophageal reflux disease without esophagitis: Secondary | ICD-10-CM

## 2021-07-13 DIAGNOSIS — J449 Chronic obstructive pulmonary disease, unspecified: Secondary | ICD-10-CM

## 2021-07-13 DIAGNOSIS — M1990 Unspecified osteoarthritis, unspecified site: Secondary | ICD-10-CM

## 2021-07-13 DIAGNOSIS — Z6831 Body mass index (BMI) 31.0-31.9, adult: Principal | ICD-10-CM

## 2021-07-13 DIAGNOSIS — L299 Pruritus, unspecified: Secondary | ICD-10-CM

## 2021-07-13 DIAGNOSIS — J45909 Unspecified asthma, uncomplicated: Secondary | ICD-10-CM

## 2021-07-13 DIAGNOSIS — F1910 Other psychoactive substance abuse, uncomplicated: Secondary | ICD-10-CM

## 2021-07-13 MED ORDER — KETOROLAC TROMETHAMINE 60 MG/2ML IM SOLN
60 mg | Freq: Once | INTRAMUSCULAR | Status: CP
Start: 2021-07-13 — End: ?

## 2021-07-13 MED ORDER — DIPHENHYDRAMINE HCL 50 MG/ML IJ SOLN
100 mg | Freq: Once | INTRAMUSCULAR | Status: CP
Start: 2021-07-13 — End: ?

## 2021-07-13 MED ORDER — DIPHENHYDRAMINE HCL 50 MG/ML IJ SOLN
25 mg | Freq: Once | INTRAVENOUS | Status: CN
Start: 2021-07-13 — End: ?

## 2021-07-13 MED ORDER — METHYLPREDNISOLONE SODIUM SUCC 125 MG IJ SOLR CUSTOM SH
125 mg | Freq: Once | INTRAMUSCULAR | Status: CP
Start: 2021-07-13 — End: ?

## 2021-07-15 MED ORDER — CETIRIZINE HCL 10 MG PO TABS
10 mg | Freq: Every evening | ORAL | 3 refills | Status: CP
Start: 2021-07-15 — End: ?

## 2021-07-18 ENCOUNTER — Encounter: Payer: Medicare Other | Attending: Medical | Primary: Family Medicine

## 2021-07-24 ENCOUNTER — Encounter: Payer: Medicare Other | Attending: Medical | Primary: Family Medicine

## 2021-07-26 ENCOUNTER — Encounter: Payer: Medicare Other | Attending: Medical | Primary: Family Medicine

## 2021-08-03 ENCOUNTER — Encounter: Payer: Medicare Other | Attending: Family Medicine | Primary: Family Medicine

## 2021-08-07 DIAGNOSIS — J4540 Moderate persistent asthma, uncomplicated: Principal | ICD-10-CM

## 2021-08-07 MED ORDER — BUDESONIDE-FORMOTEROL FUMARATE 160-4.5 MCG/ACT IN AERO
9 refills | Status: CP
Start: 2021-08-07 — End: ?

## 2021-12-24 ENCOUNTER — Encounter: Payer: Medicare Other | Attending: Family Medicine | Primary: Family Medicine

## 2021-12-26 ENCOUNTER — Ambulatory Visit: Payer: Medicare Other | Attending: Family Medicine | Primary: Family Medicine

## 2021-12-26 DIAGNOSIS — R519 Headache: Secondary | ICD-10-CM

## 2021-12-26 DIAGNOSIS — D689 Coagulation defect, unspecified: Secondary | ICD-10-CM

## 2021-12-26 DIAGNOSIS — M8700 Idiopathic aseptic necrosis of unspecified bone: Secondary | ICD-10-CM

## 2021-12-26 DIAGNOSIS — M1990 Unspecified osteoarthritis, unspecified site: Secondary | ICD-10-CM

## 2021-12-26 DIAGNOSIS — F419 Anxiety disorder, unspecified: Secondary | ICD-10-CM

## 2021-12-26 DIAGNOSIS — G4733 Obstructive sleep apnea (adult) (pediatric): Secondary | ICD-10-CM

## 2021-12-26 DIAGNOSIS — T148XXA Other injury of unspecified body region, initial encounter: Secondary | ICD-10-CM

## 2021-12-26 DIAGNOSIS — R1011 Right upper quadrant pain: Secondary | ICD-10-CM

## 2021-12-26 DIAGNOSIS — Z96641 Presence of right artificial hip joint: Secondary | ICD-10-CM

## 2021-12-26 DIAGNOSIS — E669 Obesity, unspecified: Secondary | ICD-10-CM

## 2021-12-26 DIAGNOSIS — J45909 Unspecified asthma, uncomplicated: Secondary | ICD-10-CM

## 2021-12-26 DIAGNOSIS — Z96642 Presence of left artificial hip joint: Secondary | ICD-10-CM

## 2021-12-26 DIAGNOSIS — Z6832 Body mass index (BMI) 32.0-32.9, adult: Principal | ICD-10-CM

## 2021-12-26 DIAGNOSIS — R5383 Other fatigue: Secondary | ICD-10-CM

## 2021-12-26 DIAGNOSIS — S92901D Unspecified fracture of right foot, subsequent encounter for fracture with routine healing: Secondary | ICD-10-CM

## 2021-12-26 DIAGNOSIS — Z889 Allergy status to unspecified drugs, medicaments and biological substances status: Secondary | ICD-10-CM

## 2021-12-26 DIAGNOSIS — F32A Depression: Secondary | ICD-10-CM

## 2021-12-26 DIAGNOSIS — F1910 Other psychoactive substance abuse, uncomplicated: Secondary | ICD-10-CM

## 2021-12-26 DIAGNOSIS — R5381 Other malaise: Secondary | ICD-10-CM

## 2021-12-26 DIAGNOSIS — D841 Defects in the complement system: Secondary | ICD-10-CM

## 2021-12-26 DIAGNOSIS — Z9109 Other allergy status, other than to drugs and biological substances: Secondary | ICD-10-CM

## 2021-12-26 DIAGNOSIS — I82409 Acute embolism and thrombosis of unspecified deep veins of unspecified lower extremity: Principal | ICD-10-CM

## 2021-12-26 DIAGNOSIS — J449 Chronic obstructive pulmonary disease, unspecified: Secondary | ICD-10-CM

## 2021-12-26 DIAGNOSIS — K219 Gastro-esophageal reflux disease without esophagitis: Secondary | ICD-10-CM

## 2021-12-27 ENCOUNTER — Encounter: Payer: Medicare Other | Primary: Family Medicine

## 2021-12-27 DIAGNOSIS — R1011 Right upper quadrant pain: Principal | ICD-10-CM
